# Patient Record
Sex: Female | Born: 1990 | Race: White | Hispanic: No | Marital: Single | Smoking: Former smoker
Health system: Southern US, Community
[De-identification: ages and names within clinical notes are randomized; demographics above are authoritative.]

## PROBLEM LIST (undated history)

## (undated) DIAGNOSIS — T7840XA Allergy, unspecified, initial encounter: Secondary | ICD-10-CM

## (undated) DIAGNOSIS — F319 Bipolar disorder, unspecified: Secondary | ICD-10-CM

## (undated) DIAGNOSIS — G43909 Migraine, unspecified, not intractable, without status migrainosus: Secondary | ICD-10-CM

## (undated) DIAGNOSIS — M1990 Unspecified osteoarthritis, unspecified site: Secondary | ICD-10-CM

## (undated) DIAGNOSIS — M19179 Post-traumatic osteoarthritis, unspecified ankle and foot: Secondary | ICD-10-CM

## (undated) DIAGNOSIS — R519 Headache, unspecified: Secondary | ICD-10-CM

## (undated) DIAGNOSIS — R51 Headache: Secondary | ICD-10-CM

## (undated) DIAGNOSIS — S82201B Unspecified fracture of shaft of right tibia, initial encounter for open fracture type I or II: Secondary | ICD-10-CM

## (undated) DIAGNOSIS — I209 Angina pectoris, unspecified: Secondary | ICD-10-CM

## (undated) DIAGNOSIS — S93326A Dislocation of tarsometatarsal joint of unspecified foot, initial encounter: Secondary | ICD-10-CM

## (undated) DIAGNOSIS — E669 Obesity, unspecified: Secondary | ICD-10-CM

## (undated) DIAGNOSIS — S92102A Unspecified fracture of left talus, initial encounter for closed fracture: Secondary | ICD-10-CM

## (undated) DIAGNOSIS — K219 Gastro-esophageal reflux disease without esophagitis: Secondary | ICD-10-CM

## (undated) DIAGNOSIS — S46909A Unspecified injury of unspecified muscle, fascia and tendon at shoulder and upper arm level, unspecified arm, initial encounter: Secondary | ICD-10-CM

## (undated) DIAGNOSIS — S82891B Other fracture of right lower leg, initial encounter for open fracture type I or II: Secondary | ICD-10-CM

## (undated) HISTORY — DX: Allergy, unspecified, initial encounter: T78.40XA

## (undated) HISTORY — PX: FRACTURE SURGERY: SHX138

## (undated) HISTORY — DX: Bipolar disorder, unspecified: F31.9

## (undated) HISTORY — PX: LEG SURGERY: SHX1003

---
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
## 2011-01-12 ENCOUNTER — Inpatient Hospital Stay (HOSPITAL_COMMUNITY): Payer: BC Managed Care – PPO

## 2011-01-12 ENCOUNTER — Emergency Department (HOSPITAL_COMMUNITY): Payer: BC Managed Care – PPO

## 2011-01-12 ENCOUNTER — Inpatient Hospital Stay (HOSPITAL_COMMUNITY)
Admission: EM | Admit: 2011-01-12 | Discharge: 2011-02-01 | DRG: 217 | Disposition: A | Discharge status: Home Health | Payer: BC Managed Care – PPO | Source: Ambulatory Visit | Attending: General Surgery | Admitting: General Surgery

## 2011-01-12 DIAGNOSIS — S8263XB Displaced fracture of lateral malleolus of unspecified fibula, initial encounter for open fracture type I or II: Secondary | ICD-10-CM | POA: Diagnosis present

## 2011-01-12 DIAGNOSIS — S0180XA Unspecified open wound of other part of head, initial encounter: Secondary | ICD-10-CM

## 2011-01-12 DIAGNOSIS — S060XAA Concussion with loss of consciousness status unknown, initial encounter: Secondary | ICD-10-CM | POA: Diagnosis present

## 2011-01-12 DIAGNOSIS — S92309A Fracture of unspecified metatarsal bone(s), unspecified foot, initial encounter for closed fracture: Secondary | ICD-10-CM | POA: Diagnosis present

## 2011-01-12 DIAGNOSIS — K219 Gastro-esophageal reflux disease without esophagitis: Secondary | ICD-10-CM | POA: Diagnosis present

## 2011-01-12 DIAGNOSIS — F17200 Nicotine dependence, unspecified, uncomplicated: Secondary | ICD-10-CM | POA: Diagnosis present

## 2011-01-12 DIAGNOSIS — IMO0002 Reserved for concepts with insufficient information to code with codable children: Secondary | ICD-10-CM

## 2011-01-12 DIAGNOSIS — J45990 Exercise induced bronchospasm: Secondary | ICD-10-CM | POA: Diagnosis present

## 2011-01-12 DIAGNOSIS — S93439A Sprain of tibiofibular ligament of unspecified ankle, initial encounter: Secondary | ICD-10-CM | POA: Diagnosis present

## 2011-01-12 DIAGNOSIS — S060X9A Concussion with loss of consciousness of unspecified duration, initial encounter: Secondary | ICD-10-CM | POA: Diagnosis present

## 2011-01-12 DIAGNOSIS — S82209B Unspecified fracture of shaft of unspecified tibia, initial encounter for open fracture type I or II: Principal | ICD-10-CM | POA: Diagnosis present

## 2011-01-12 DIAGNOSIS — F22 Delusional disorders: Secondary | ICD-10-CM | POA: Diagnosis present

## 2011-01-12 DIAGNOSIS — E669 Obesity, unspecified: Secondary | ICD-10-CM | POA: Diagnosis present

## 2011-01-12 DIAGNOSIS — S92253A Displaced fracture of navicular [scaphoid] of unspecified foot, initial encounter for closed fracture: Secondary | ICD-10-CM | POA: Diagnosis present

## 2011-01-12 DIAGNOSIS — S022XXA Fracture of nasal bones, initial encounter for closed fracture: Secondary | ICD-10-CM | POA: Diagnosis present

## 2011-01-12 DIAGNOSIS — S61409A Unspecified open wound of unspecified hand, initial encounter: Secondary | ICD-10-CM | POA: Diagnosis present

## 2011-01-12 DIAGNOSIS — E119 Type 2 diabetes mellitus without complications: Secondary | ICD-10-CM | POA: Diagnosis present

## 2011-01-12 DIAGNOSIS — S82409B Unspecified fracture of shaft of unspecified fibula, initial encounter for open fracture type I or II: Principal | ICD-10-CM | POA: Diagnosis present

## 2011-01-12 DIAGNOSIS — M25571 Pain in right ankle and joints of right foot: Secondary | ICD-10-CM

## 2011-01-12 DIAGNOSIS — S41009A Unspecified open wound of unspecified shoulder, initial encounter: Secondary | ICD-10-CM | POA: Diagnosis present

## 2011-01-12 DIAGNOSIS — S92109A Unspecified fracture of unspecified talus, initial encounter for closed fracture: Secondary | ICD-10-CM | POA: Diagnosis present

## 2011-01-12 DIAGNOSIS — S0120XA Unspecified open wound of nose, initial encounter: Secondary | ICD-10-CM | POA: Diagnosis present

## 2011-01-12 DIAGNOSIS — D62 Acute posthemorrhagic anemia: Secondary | ICD-10-CM | POA: Diagnosis present

## 2011-01-12 DIAGNOSIS — S92213A Displaced fracture of cuboid bone of unspecified foot, initial encounter for closed fracture: Secondary | ICD-10-CM | POA: Diagnosis present

## 2011-01-12 DIAGNOSIS — S82899A Other fracture of unspecified lower leg, initial encounter for closed fracture: Secondary | ICD-10-CM

## 2011-01-12 DIAGNOSIS — T07XXXA Unspecified multiple injuries, initial encounter: Secondary | ICD-10-CM

## 2011-01-12 DIAGNOSIS — F1910 Other psychoactive substance abuse, uncomplicated: Secondary | ICD-10-CM | POA: Diagnosis present

## 2011-01-12 DIAGNOSIS — Z7901 Long term (current) use of anticoagulants: Secondary | ICD-10-CM

## 2011-01-12 LAB — URINALYSIS, ROUTINE W REFLEX MICROSCOPIC
Glucose, UA: NEGATIVE mg/dL
Leukocytes, UA: NEGATIVE
Nitrite: NEGATIVE
Specific Gravity, Urine: 1.037 — ABNORMAL HIGH (ref 1.005–1.030)
pH: 5.5 (ref 5.0–8.0)

## 2011-01-12 LAB — COMPREHENSIVE METABOLIC PANEL
ALT: 30 U/L (ref 0–35)
Albumin: 3.8 g/dL (ref 3.5–5.2)
Calcium: 8.9 mg/dL (ref 8.4–10.5)
GFR calc Af Amer: >60 mL/min (ref >60)
Glucose, Bld: 177 mg/dL — ABNORMAL HIGH (ref 70–99)
Potassium: 3 mEq/L — ABNORMAL LOW (ref 3.5–5.1)
Sodium: 139 mEq/L (ref 135–145)
Total Protein: 7.1 g/dL (ref 6.0–8.3)

## 2011-01-12 LAB — BASIC METABOLIC PANEL
CO2: 21 mEq/L (ref 19–32)
Chloride: 108 mEq/L (ref 96–112)
GFR calc non Af Amer: >60 mL/min (ref >60)
Glucose, Bld: 120 mg/dL — ABNORMAL HIGH (ref 70–99)
Potassium: 4.2 mEq/L (ref 3.5–5.1)
Sodium: 139 mEq/L (ref 135–145)

## 2011-01-12 LAB — TYPE AND SCREEN
Antibody Screen: NEGATIVE
Unit division: 0
Unit division: 0

## 2011-01-12 LAB — POCT I-STAT, CHEM 8
BUN: 13 mg/dL (ref 6–23)
Calcium, Ion: 1.08 mmol/L — ABNORMAL LOW (ref 1.12–1.32)
Chloride: 105 mEq/L (ref 96–112)
Glucose, Bld: 176 mg/dL — ABNORMAL HIGH (ref 70–99)
HCT: 40 % (ref 36.0–46.0)

## 2011-01-12 LAB — HCG, QUANTITATIVE, PREGNANCY: hCG, Beta Chain, Quant, S: 1 m[IU]/mL (ref <5)

## 2011-01-12 LAB — CBC
HCT: 35.9 % — ABNORMAL LOW (ref 36.0–46.0)
HCT: 25.8 % — ABNORMAL LOW (ref 36.0–46.0)
Hemoglobin: 12.7 g/dL (ref 12.0–15.0)
Hemoglobin: 8.8 g/dL — ABNORMAL LOW (ref 12.0–15.0)
MCV: 86.7 fL (ref 78.0–100.0)
RBC: 4.14 MIL/uL (ref 3.87–5.11)
RBC: 2.93 MIL/uL — ABNORMAL LOW (ref 3.87–5.11)
RDW: 12.8 % (ref 11.5–15.5)
WBC: 21.6 10*3/uL — ABNORMAL HIGH (ref 4.0–10.5)
WBC: 8.1 10*3/uL (ref 4.0–10.5)

## 2011-01-12 LAB — ETHANOL: Alcohol, Ethyl (B): 205 mg/dL — ABNORMAL HIGH (ref 0–11)

## 2011-01-12 LAB — RAPID URINE DRUG SCREEN, HOSP PERFORMED: Opiates: NOT DETECTED

## 2011-01-12 LAB — PROTIME-INR: INR: 1.01 (ref 0.00–1.49)

## 2011-01-12 LAB — URINE MICROSCOPIC-ADD ON

## 2011-01-12 LAB — GLUCOSE, CAPILLARY: Glucose-Capillary: 128 mg/dL — ABNORMAL HIGH (ref 70–99)

## 2011-01-12 LAB — ABO/RH: ABO/RH(D): A POS

## 2011-01-12 MED ORDER — IOHEXOL 300 MG/ML  SOLN
100.0000 mL | Freq: Once | INTRAMUSCULAR | Status: AC | PRN
  Administered 2011-01-12: 100 mL via INTRAVENOUS

## 2011-01-13 ENCOUNTER — Inpatient Hospital Stay (HOSPITAL_COMMUNITY): Payer: BC Managed Care – PPO

## 2011-01-13 LAB — CBC
HCT: 25 % — ABNORMAL LOW (ref 36.0–46.0)
Hemoglobin: 8.5 g/dL — ABNORMAL LOW (ref 12.0–15.0)
MCH: 29.9 pg (ref 26.0–34.0)
MCV: 88 fL (ref 78.0–100.0)
RBC: 2.84 MIL/uL — ABNORMAL LOW (ref 3.87–5.11)

## 2011-01-13 LAB — BASIC METABOLIC PANEL
Calcium: 8.1 mg/dL — ABNORMAL LOW (ref 8.4–10.5)
GFR calc Af Amer: >60 mL/min (ref >60)
GFR calc non Af Amer: >60 mL/min (ref >60)
Potassium: 3.8 mEq/L (ref 3.5–5.1)
Sodium: 137 mEq/L (ref 135–145)

## 2011-01-14 ENCOUNTER — Inpatient Hospital Stay (HOSPITAL_COMMUNITY): Payer: BC Managed Care – PPO

## 2011-01-14 LAB — BASIC METABOLIC PANEL
BUN: 3 mg/dL — ABNORMAL LOW (ref 6–23)
Chloride: 102 mEq/L (ref 96–112)
GFR calc Af Amer: >60 mL/min (ref >60)
GFR calc non Af Amer: >60 mL/min (ref >60)
Potassium: 3.7 mEq/L (ref 3.5–5.1)
Sodium: 135 mEq/L (ref 135–145)

## 2011-01-14 LAB — CBC
HCT: 22.3 % — ABNORMAL LOW (ref 36.0–46.0)
Hemoglobin: 7.7 g/dL — ABNORMAL LOW (ref 12.0–15.0)
MCHC: 34.5 g/dL (ref 30.0–36.0)
RDW: 12.7 % (ref 11.5–15.5)
WBC: 6.9 10*3/uL (ref 4.0–10.5)

## 2011-01-14 LAB — URINALYSIS, DIPSTICK ONLY
Glucose, UA: NEGATIVE mg/dL
Leukocytes, UA: NEGATIVE
Nitrite: NEGATIVE
Protein, ur: NEGATIVE mg/dL
pH: 6 (ref 5.0–8.0)

## 2011-01-15 ENCOUNTER — Inpatient Hospital Stay (HOSPITAL_COMMUNITY): Payer: BC Managed Care – PPO

## 2011-01-15 LAB — BASIC METABOLIC PANEL
BUN: 4 mg/dL — ABNORMAL LOW (ref 6–23)
CO2: 26 mEq/L (ref 19–32)
Chloride: 102 mEq/L (ref 96–112)
Glucose, Bld: 144 mg/dL — ABNORMAL HIGH (ref 70–99)
Potassium: 4.2 mEq/L (ref 3.5–5.1)
Sodium: 134 mEq/L — ABNORMAL LOW (ref 135–145)

## 2011-01-15 LAB — URINALYSIS, MICROSCOPIC ONLY
Bilirubin Urine: NEGATIVE
Hgb urine dipstick: NEGATIVE
Nitrite: NEGATIVE
Specific Gravity, Urine: 1.018 (ref 1.005–1.030)
pH: 6 (ref 5.0–8.0)

## 2011-01-15 LAB — CBC
HCT: 21.2 % — ABNORMAL LOW (ref 36.0–46.0)
Hemoglobin: 7.2 g/dL — ABNORMAL LOW (ref 12.0–15.0)
MCHC: 34 g/dL (ref 30.0–36.0)
RBC: 2.45 MIL/uL — ABNORMAL LOW (ref 3.87–5.11)

## 2011-01-16 LAB — URINE CULTURE: Colony Count: NO GROWTH

## 2011-01-16 LAB — CBC
MCH: 29.6 pg (ref 26.0–34.0)
Platelets: 135 10*3/uL — ABNORMAL LOW (ref 150–400)
RBC: 2.13 MIL/uL — ABNORMAL LOW (ref 3.87–5.11)
RDW: 12.7 % (ref 11.5–15.5)
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
## 2011-01-16 NOTE — Op Note (Signed)
NAME:  [name], [name] NO.:  [id number]  MEDICAL RECORD NO.:  [id number]  LOCATION:  3310                         FACILITY:  [facility]  PHYSICIAN:  Johnette Abraham, MD    DATE OF BIRTH:  1990-07-28  DATE OF PROCEDURE:  01/12/2011 DATE OF DISCHARGE:                              OPERATIVE REPORT   PREOPERATIVE DIAGNOSES:  Complex laceration of the right hand with possible of flexor tendon injury of the right ring finger and complex laceration of the right shoulder.  POSTOPERATIVE DIAGNOSES:  Complex laceration of the right hand with possible of flexor tendon injury of the right ring finger and complex laceration of the right shoulder.  PROCEDURE: 1. Exploration of wounds x2 of the right hand, the right ring finger,     and the right long finger wounds.  Repair of the FDS and FDP in     zone II to III of the right ring finger.  Release of the A1 pulley     of the right ring finger.  Reconstruction of the A2 pulley of the     right ring finger.  Closure of lacerations of the right hand,     totaling 8 cm. 2. Irrigation and debridement of full-thickness skin, subcutaneous     tissue, and muscle of the right shoulder and layered closure of the     shoulder wound measuring 6 cm.  SURGEON:  Maya Arcand C. Izora Ribas, MD  ASSISTANT:  None.  ANESTHESIA:  General.  Of note, this procedure was performed at the conclusion of Dr. Ashok Norris procedure.  FINDINGS:  Flexor tendon laceration of the right ring finger including the FDS and FDP, multiple lacerations and abrasions about the right palm and right forearm, complex laceration and gaping wound of the right shoulder approximately 5 x 4 cm in diameter down and into the deltoid muscle.  SPECIMENS:  No specimens.  ESTIMATED BLOOD LOSS:  Minimal.  COMPLICATIONS:  No acute complications.  PROCEDURE:  The patient was already intubated and asleep while I evaluated and started my procedure.  A time-out was performed.   The right upper extremity was prepped and draped in normal sterile fashion. The arm was exsanguinated.  Tourniquet was used.  Initially, all of the wounds on the hand were scrubbed and irrigated thoroughly with irrigation solution.  There was some dirt and wooden foreign bodies that were removed from the wounds at the base of the right ring and right long fingers.  Each wound was then explored and evaluated.  The wound of the right ring finger was all the way down to the radial-sided neurovascular bundle.  However, the neurovascular bundles were intact. The ulnar neurovascular bundles were also intact.  Flexor tendons were intact.  This wound extended along the radial aspect into the web space and there was a separate approximately 1-cm wound on the dorsal aspect of the hand.  All of these were irrigated out.  The wound on the dorsum was fairly superficial down through the subcutaneous tissue but did not involve the tendon.  There was a small about 0.5 cm laceration of the base of the right index finger.  This was also irrigated.  This was fairly  superficial and did not go beyond the subcutaneous tissues.  The larger and more serious of the wounds was a sort of a stellate laceration at the base of the right ring finger measuring approximately 4.5 cm.  This wound was quite deep.  The edges of the flexor tendon were visible once the wound was opened.  The wound was then opened slightly in a proximal and distal direction to obtain good exposure.  The neurovascular bundles were intact, however.  Both the FDS and FDP tendons were lacerated just underneath the A2 pulley.  To gain length, the A1 pulley was released and the A2 pulley was taken down in a stepwise fashion for approximately 50% of the length.  The FDP and FDS tendons were identified after the wound was cleansed.  The FDP was repaired first with 4-0 FiberWire in a modified Kessler-type repair.  A 5-0 Prolene was used in a  circumferential running fashion nicely approximating the ends.  Gentle range of motion after this repair did not reveal any gaping of the repair.  The FDS was brought into approximation and again repaired in a similar fashion with 4-0 FiberWire.  To prevent bowstringing, the stepwise fashion edges of the A2 pulley were brought into approximation and sutured with Prolene. Gentle range of motion did not reveal any catching of the tendon repair on this pulley reconstruction.  Following all wounds were repaired with interrupted 5-0 nylon sutures, the sum length of the repairs of all these lacerations was 8 cm.  The tourniquet was then released.  All fingers returned to nice pink color.  There was no gross bleeding from wound.  Sterile dressing and splint were applied.  Next, the strap was removed and the right shoulder was reprepped and draped.  A pulse lavage was used to nicely irrigate the wound cavity.  The wound cavity measured approximately 5 x 4 cm.  It was down through the deltopectoral fascia and some of the deltoid muscle fibers were visible.  Hemostasis was obtained.  Once the wounds was cleansed, the deltopectoral fascia was closed with multiple interrupted 3-0 Vicryl sutures.  Subcutaneous layers were also repaired with interrupted 3-0 Vicryl sutures.  The edges of the wound were jagged and therefore, an elliptical incision was made outside of each wound edge creating a nice sharp skin edges for closure.  The skin was closed with a running subcuticular 4-0 Vicryl. Steri-Strips and sterile dressing were obtained.  The patient tolerated these procedures well.     Johnette Abraham, MD     HCC/MEDQ  D:  01/12/2011  T:  01/12/2011  Job:  [id number]  Electronically Signed by Knute Neu MD on 01/16/2011 07:24:56 AM
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
## 2011-01-17 LAB — BASIC METABOLIC PANEL
Calcium: 8.5 mg/dL (ref 8.4–10.5)
Chloride: 95 mEq/L — ABNORMAL LOW (ref 96–112)
Creatinine, Ser: 0.65 mg/dL (ref 0.50–1.10)
GFR calc Af Amer: >60 mL/min (ref >60)
GFR calc non Af Amer: >60 mL/min (ref >60)

## 2011-01-17 LAB — TYPE AND SCREEN
ABO/RH(D): A POS
Antibody Screen: NEGATIVE
Unit division: 0

## 2011-01-17 LAB — CBC
MCHC: 35 g/dL (ref 30.0–36.0)
MCV: 85 fL (ref 78.0–100.0)
Platelets: 178 10*3/uL (ref 150–400)
RDW: 13.3 % (ref 11.5–15.5)
WBC: 6 10*3/uL (ref 4.0–10.5)

## 2011-01-18 LAB — CBC
Platelets: 165 10*3/uL (ref 150–400)
RBC: 2.76 MIL/uL — ABNORMAL LOW (ref 3.87–5.11)
RDW: 13.2 % (ref 11.5–15.5)
WBC: 4.5 10*3/uL (ref 4.0–10.5)

## 2011-01-18 LAB — BASIC METABOLIC PANEL
CO2: 29 mEq/L (ref 19–32)
Chloride: 97 mEq/L (ref 96–112)
GFR calc Af Amer: >60 mL/min (ref >60)
Potassium: 3.5 mEq/L (ref 3.5–5.1)
Sodium: 134 mEq/L — ABNORMAL LOW (ref 135–145)

## 2011-01-19 LAB — CBC
HCT: 23.6 % — ABNORMAL LOW (ref 36.0–46.0)
MCV: 86.1 fL (ref 78.0–100.0)
Platelets: 211 10*3/uL (ref 150–400)
RBC: 2.74 MIL/uL — ABNORMAL LOW (ref 3.87–5.11)
RDW: 12.9 % (ref 11.5–15.5)
WBC: 5.3 10*3/uL (ref 4.0–10.5)

## 2011-01-20 LAB — BASIC METABOLIC PANEL
BUN: 7 mg/dL (ref 6–23)
CO2: 30 mEq/L (ref 19–32)
GFR calc non Af Amer: >60 mL/min (ref >60)
Glucose, Bld: 88 mg/dL (ref 70–99)
Potassium: 3.9 mEq/L (ref 3.5–5.1)
Sodium: 134 mEq/L — ABNORMAL LOW (ref 135–145)

## 2011-01-20 LAB — CBC
HCT: 26.1 % — ABNORMAL LOW (ref 36.0–46.0)
Hemoglobin: 8.9 g/dL — ABNORMAL LOW (ref 12.0–15.0)
MCH: 29.2 pg (ref 26.0–34.0)
MCHC: 34.1 g/dL (ref 30.0–36.0)
RBC: 3.05 MIL/uL — ABNORMAL LOW (ref 3.87–5.11)

## 2011-01-21 ENCOUNTER — Inpatient Hospital Stay (HOSPITAL_COMMUNITY): Payer: BC Managed Care – PPO

## 2011-01-22 ENCOUNTER — Inpatient Hospital Stay (HOSPITAL_COMMUNITY): Payer: BC Managed Care – PPO

## 2011-01-22 LAB — BASIC METABOLIC PANEL
BUN: 8 mg/dL (ref 6–23)
CO2: 28 mEq/L (ref 19–32)
Chloride: 96 mEq/L (ref 96–112)
Creatinine, Ser: 0.66 mg/dL (ref 0.50–1.10)
GFR calc Af Amer: >60 mL/min (ref >60)
GFR calc non Af Amer: >60 mL/min (ref >60)
Glucose, Bld: 112 mg/dL — ABNORMAL HIGH (ref 70–99)
Potassium: 3.7 mEq/L (ref 3.5–5.1)
Sodium: 133 mEq/L — ABNORMAL LOW (ref 135–145)

## 2011-01-22 LAB — CBC
Hemoglobin: 9.3 g/dL — ABNORMAL LOW (ref 12.0–15.0)
Platelets: 380 10*3/uL (ref 150–400)
RBC: 3.23 MIL/uL — ABNORMAL LOW (ref 3.87–5.11)
WBC: 7.9 10*3/uL (ref 4.0–10.5)

## 2011-01-22 LAB — CULTURE, BLOOD (ROUTINE X 2)
Culture  Setup Time: 201207250227
Culture: NO GROWTH
Culture: NO GROWTH

## 2011-01-22 LAB — GRAM STAIN

## 2011-01-23 LAB — CBC
Hemoglobin: 8.4 g/dL — ABNORMAL LOW (ref 12.0–15.0)
MCH: 28.2 pg (ref 26.0–34.0)
RBC: 2.98 MIL/uL — ABNORMAL LOW (ref 3.87–5.11)

## 2011-01-25 LAB — WOUND CULTURE: Culture: NO GROWTH

## 2011-01-25 LAB — TYPE AND SCREEN
Antibody Screen: NEGATIVE
Unit division: 0

## 2011-01-25 LAB — PROTIME-INR: Prothrombin Time: 14.5 seconds (ref 11.6–15.2)

## 2011-01-26 LAB — PROTIME-INR
INR: 1.29 (ref 0.00–1.49)
Prothrombin Time: 16.3 seconds — ABNORMAL HIGH (ref 11.6–15.2)

## 2011-01-27 LAB — PROTIME-INR: Prothrombin Time: 19.5 seconds — ABNORMAL HIGH (ref 11.6–15.2)

## 2011-01-27 LAB — ANAEROBIC CULTURE

## 2011-01-28 LAB — PROTIME-INR: INR: 2.01 — ABNORMAL HIGH (ref 0.00–1.49)

## 2011-01-28 NOTE — Op Note (Signed)
  NAME:  [name], [name] NO.:  [id number]  MEDICAL RECORD NO.:  [id number]  LOCATION:  [facility]                         FACILITY:  [facility]  PHYSICIAN:  Almond Lint, MD       DATE OF BIRTH:  12-19-1990  DATE OF PROCEDURE:  01/12/2011 DATE OF DISCHARGE:                              OPERATIVE REPORT   PREOPERATIVE DIAGNOSIS:  Inadequate IV access.  POSTOPERATIVE DIAGNOSIS:  Inadequate IV access.  PROCEDURE PERFORMED:  Left femoral central line placement.  SURGEON:  Almond Lint, MD  ANESTHESIA:  Local and IV narcotics.  ESTIMATED BLOOD LOSS:  Minimal.  COMPLICATIONS:  None known.  PROCEDURE:  [name] was identified in her bed and had no IV access.  A left subclavian line was attempted, but the vein was unable to be accessed.  Her left groin was then prepped and draped in sterile fashion. The artery was accessed briefly once and then the left vein was accessed in the second stick.  The wire was threaded easily.  Needle was removed and the skin nick was made with #11 blade.  The Cordis was passed over the wire and the dilator was removed.  Good blood return and easy flush was obtained.  This was sutured to the skin and dressed with a sterile dressing.  The patient tolerated the procedure well.  She then went to the CT scan.     Almond Lint, MD     FB/MEDQ  D:  01/12/2011  T:  01/12/2011  Job:  [id number]  Electronically Signed by Almond Lint MD on 01/28/2011 01:58:00 PM
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
## 2011-01-29 DIAGNOSIS — S82209A Unspecified fracture of shaft of unspecified tibia, initial encounter for closed fracture: Secondary | ICD-10-CM

## 2011-01-29 DIAGNOSIS — S069X9A Unspecified intracranial injury with loss of consciousness of unspecified duration, initial encounter: Secondary | ICD-10-CM

## 2011-01-29 DIAGNOSIS — S92309A Fracture of unspecified metatarsal bone(s), unspecified foot, initial encounter for closed fracture: Secondary | ICD-10-CM

## 2011-01-29 DIAGNOSIS — S0280XA Fracture of other specified skull and facial bones, unspecified side, initial encounter for closed fracture: Secondary | ICD-10-CM

## 2011-01-29 DIAGNOSIS — S92209A Fracture of unspecified tarsal bone(s) of unspecified foot, initial encounter for closed fracture: Secondary | ICD-10-CM

## 2011-01-30 LAB — CBC
HCT: 31.9 % — ABNORMAL LOW (ref 36.0–46.0)
Hemoglobin: 10.2 g/dL — ABNORMAL LOW (ref 12.0–15.0)
RBC: 3.69 MIL/uL — ABNORMAL LOW (ref 3.87–5.11)
WBC: 4.1 10*3/uL (ref 4.0–10.5)

## 2011-01-30 LAB — PROTIME-INR: INR: 2.27 — ABNORMAL HIGH (ref 0.00–1.49)

## 2011-01-31 LAB — PROTIME-INR: INR: 2.19 — ABNORMAL HIGH (ref 0.00–1.49)

## 2011-02-01 LAB — PROTIME-INR
INR: 2.11 — ABNORMAL HIGH (ref 0.00–1.49)
Prothrombin Time: 24 seconds — ABNORMAL HIGH (ref 11.6–15.2)
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
## 2011-02-13 ENCOUNTER — Telehealth (INDEPENDENT_AMBULATORY_CARE_PROVIDER_SITE_OTHER): Payer: Self-pay | Admitting: Orthopedic Surgery

## 2011-02-13 NOTE — Telephone Encounter (Signed)
Mom called to say [name] had not had a BM in 6 days. Has been on Dulcolax 5mg  daily and colace 100mg  bid since d/c and miralax 17g daily for the past 4 days. I advised her to increase Dulcolax to 15mg  daily and if no BM by tomorrow night to take 1 bottle of magnesium citrate.
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
## 2011-03-18 ENCOUNTER — Ambulatory Visit
Admission: RE | Admit: 2011-03-18 | Discharge: 2011-03-18 | Disposition: A | Discharge status: Home / Self Care | Payer: BC Managed Care – PPO | Source: Ambulatory Visit | Attending: Orthopedic Surgery | Admitting: Orthopedic Surgery

## 2011-03-18 ENCOUNTER — Other Ambulatory Visit: Payer: Self-pay | Admitting: Orthopedic Surgery

## 2011-03-18 DIAGNOSIS — M25579 Pain in unspecified ankle and joints of unspecified foot: Secondary | ICD-10-CM

## 2011-03-25 DIAGNOSIS — S82201B Unspecified fracture of shaft of right tibia, initial encounter for open fracture type I or II: Secondary | ICD-10-CM

## 2011-03-25 DIAGNOSIS — S46909A Unspecified injury of unspecified muscle, fascia and tendon at shoulder and upper arm level, unspecified arm, initial encounter: Secondary | ICD-10-CM

## 2011-03-25 DIAGNOSIS — S92102A Unspecified fracture of left talus, initial encounter for closed fracture: Secondary | ICD-10-CM

## 2011-03-25 DIAGNOSIS — S82891B Other fracture of right lower leg, initial encounter for open fracture type I or II: Secondary | ICD-10-CM

## 2011-03-25 DIAGNOSIS — S93326A Dislocation of tarsometatarsal joint of unspecified foot, initial encounter: Secondary | ICD-10-CM

## 2011-03-25 HISTORY — DX: Unspecified injury of unspecified muscle, fascia and tendon at shoulder and upper arm level, unspecified arm, initial encounter: S46.909A

## 2011-03-25 HISTORY — DX: Unspecified fracture of shaft of right tibia, initial encounter for open fracture type I or II: S82.201B

## 2011-03-25 HISTORY — DX: Other fracture of right lower leg, initial encounter for open fracture type I or II: S82.891B

## 2011-03-25 HISTORY — DX: Unspecified fracture of left talus, initial encounter for closed fracture: S92.102A

## 2011-03-25 HISTORY — DX: Dislocation of tarsometatarsal joint of unspecified foot, initial encounter: S93.326A

## 2011-03-27 ENCOUNTER — Other Ambulatory Visit: Payer: Self-pay | Admitting: General Surgery

## 2011-03-27 DIAGNOSIS — M79641 Pain in right hand: Secondary | ICD-10-CM

## 2011-03-28 ENCOUNTER — Ambulatory Visit (HOSPITAL_COMMUNITY)
Admission: RE | Admit: 2011-03-28 | Discharge: 2011-03-28 | Disposition: A | Discharge status: Home / Self Care | Payer: BC Managed Care – PPO | Source: Ambulatory Visit | Attending: Orthopedic Surgery | Admitting: Orthopedic Surgery

## 2011-03-28 DIAGNOSIS — IMO0001 Reserved for inherently not codable concepts without codable children: Secondary | ICD-10-CM | POA: Insufficient documentation

## 2011-03-28 DIAGNOSIS — M24573 Contracture, unspecified ankle: Secondary | ICD-10-CM | POA: Insufficient documentation

## 2011-03-28 DIAGNOSIS — J45909 Unspecified asthma, uncomplicated: Secondary | ICD-10-CM | POA: Insufficient documentation

## 2011-03-28 DIAGNOSIS — Z4789 Encounter for other orthopedic aftercare: Secondary | ICD-10-CM | POA: Insufficient documentation

## 2011-03-28 LAB — APTT: aPTT: 39 seconds — ABNORMAL HIGH (ref 24–37)

## 2011-03-28 LAB — CBC
Platelets: 200 10*3/uL (ref 150–400)
RBC: 4.31 MIL/uL (ref 3.87–5.11)
RDW: 13.5 % (ref 11.5–15.5)
WBC: 5.8 10*3/uL (ref 4.0–10.5)

## 2011-03-28 LAB — PROTIME-INR: Prothrombin Time: 19.8 seconds — ABNORMAL HIGH (ref 11.6–15.2)

## 2011-03-28 LAB — HCG, SERUM, QUALITATIVE: Preg, Serum: NEGATIVE

## 2011-04-02 ENCOUNTER — Other Ambulatory Visit: Payer: BC Managed Care – PPO
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
## 2011-07-12 ENCOUNTER — Ambulatory Visit
Admission: RE | Admit: 2011-07-12 | Discharge: 2011-07-12 | Disposition: A | Discharge status: Home / Self Care | Payer: BC Managed Care – PPO | Source: Ambulatory Visit | Attending: Orthopedic Surgery | Admitting: Orthopedic Surgery

## 2011-07-12 ENCOUNTER — Other Ambulatory Visit: Payer: Self-pay | Admitting: Orthopedic Surgery

## 2011-07-12 DIAGNOSIS — S92109A Unspecified fracture of unspecified talus, initial encounter for closed fracture: Secondary | ICD-10-CM

## 2012-01-09 ENCOUNTER — Other Ambulatory Visit: Payer: Self-pay | Admitting: Orthopedic Surgery

## 2012-01-09 DIAGNOSIS — S93439A Sprain of tibiofibular ligament of unspecified ankle, initial encounter: Secondary | ICD-10-CM

## 2012-01-10 ENCOUNTER — Inpatient Hospital Stay: Admission: RE | Admit: 2012-01-10 | Payer: BC Managed Care – PPO | Source: Ambulatory Visit

## 2012-01-10 ENCOUNTER — Ambulatory Visit
Admission: RE | Admit: 2012-01-10 | Discharge: 2012-01-10 | Disposition: A | Discharge status: Home / Self Care | Payer: BC Managed Care – PPO | Source: Ambulatory Visit | Attending: Orthopedic Surgery | Admitting: Orthopedic Surgery

## 2012-01-10 DIAGNOSIS — S93439A Sprain of tibiofibular ligament of unspecified ankle, initial encounter: Secondary | ICD-10-CM

## 2012-01-24 ENCOUNTER — Encounter (HOSPITAL_COMMUNITY): Payer: Self-pay | Admitting: Pharmacy Technician

## 2012-01-27 ENCOUNTER — Encounter (HOSPITAL_COMMUNITY): Admission: RE | Admit: 2012-01-27 | Payer: BC Managed Care – PPO | Source: Ambulatory Visit

## 2012-01-28 ENCOUNTER — Encounter (HOSPITAL_COMMUNITY): Admission: RE | Payer: Self-pay | Source: Ambulatory Visit

## 2012-01-28 ENCOUNTER — Ambulatory Visit (HOSPITAL_COMMUNITY)
Admission: RE | Admit: 2012-01-28 | Payer: BC Managed Care – PPO | Source: Ambulatory Visit | Admitting: Orthopedic Surgery

## 2012-01-28 SURGERY — ARTHRODESIS ANKLE
Anesthesia: Regional | Laterality: Right

## 2012-02-07 ENCOUNTER — Encounter (HOSPITAL_COMMUNITY): Payer: Self-pay

## 2012-02-07 ENCOUNTER — Encounter (HOSPITAL_COMMUNITY)
Admission: RE | Admit: 2012-02-07 | Discharge: 2012-02-07 | Disposition: A | Discharge status: Home / Self Care | Payer: BC Managed Care – PPO | Source: Ambulatory Visit | Attending: Orthopedic Surgery | Admitting: Orthopedic Surgery

## 2012-02-07 HISTORY — DX: Unspecified osteoarthritis, unspecified site: M19.90

## 2012-02-07 LAB — TYPE AND SCREEN
ABO/RH(D): A POS
Antibody Screen: NEGATIVE

## 2012-02-07 LAB — COMPREHENSIVE METABOLIC PANEL
ALT: 31 U/L (ref 0–35)
AST: 24 U/L (ref 0–37)
Albumin: 4.2 g/dL (ref 3.5–5.2)
CO2: 28 mEq/L (ref 19–32)
Calcium: 10.1 mg/dL (ref 8.4–10.5)
Sodium: 139 mEq/L (ref 135–145)
Total Protein: 8 g/dL (ref 6.0–8.3)

## 2012-02-07 LAB — CBC WITH DIFFERENTIAL/PLATELET
Basophils Absolute: 0 10*3/uL (ref 0.0–0.1)
Basophils Relative: 0 % (ref 0–1)
Eosinophils Absolute: 0.3 10*3/uL (ref 0.0–0.7)
Eosinophils Relative: 5 % (ref 0–5)
Lymphocytes Relative: 34 % (ref 12–46)
MCV: 87.8 fL (ref 78.0–100.0)
Platelets: 192 10*3/uL (ref 150–400)
RDW: 12.5 % (ref 11.5–15.5)
WBC: 6.7 10*3/uL (ref 4.0–10.5)

## 2012-02-07 LAB — URINALYSIS, ROUTINE W REFLEX MICROSCOPIC
Ketones, ur: NEGATIVE mg/dL
Leukocytes, UA: NEGATIVE
Protein, ur: NEGATIVE mg/dL
Urobilinogen, UA: 0.2 mg/dL (ref 0.0–1.0)

## 2012-02-07 LAB — SURGICAL PCR SCREEN
MRSA, PCR: NEGATIVE
Staphylococcus aureus: NEGATIVE

## 2012-02-07 NOTE — Pre-Procedure Instructions (Signed)
21 [name]  02/07/2012   Your procedure is scheduled on:  Tuesday August 20  Report to [facility] at 5:30 AM.  Call this number if you have problems the morning of surgery: [phone]   Remember:   Do not eat food:After Midnight.  May have clear liquids:until Midnight .  Clear liquids include soda, tea, black coffee, apple or grape juice, broth.  Take these medicines the morning of surgery with A SIP OF WATER: none   Do not wear jewelry, make-up or nail polish.  Do not wear lotions, powders, or perfumes. You may wear deodorant.  Do not shave 48 hours prior to surgery. Men may shave face and neck.  Do not bring valuables to the hospital.  Contacts, dentures or bridgework may not be worn into surgery.  Leave suitcase in the car. After surgery it may be brought to your room.  For patients admitted to the hospital, checkout time is 11:00 AM the day of discharge.   Patients discharged the day of surgery will not be allowed to drive home.  Name and phone number of your driver: NA  Special Instructions: Incentive Spirometry - Practice and bring it with you on the day of surgery. and CHG Shower Use Special Wash: 1/2 bottle night before surgery and 1/2 bottle morning of surgery.   Please read over the following fact sheets that you were given: Pain Booklet, Coughing and Deep Breathing, Blood Transfusion Information and Surgical Site Infection Prevention

## 2012-02-08 LAB — URINE CULTURE

## 2012-02-10 MED ORDER — CHLORHEXIDINE GLUCONATE 4 % EX LIQD: 60.0000 mL | Freq: Once | CUTANEOUS | Status: DC

## 2012-02-10 MED ORDER — VANCOMYCIN HCL 1000 MG IV SOLR
1500.0000 mg | INTRAVENOUS | Status: AC
  Administered 2012-02-11: 1500 mg via INTRAVENOUS
  Filled 2012-02-10: qty 1500

## 2012-02-10 MED ORDER — LACTATED RINGERS IV SOLN: INTRAVENOUS | Status: DC
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
## 2012-02-11 ENCOUNTER — Inpatient Hospital Stay (HOSPITAL_COMMUNITY)
Admission: RE | Admit: 2012-02-11 | Discharge: 2012-02-14 | DRG: 218 | Disposition: A | Discharge status: Home / Self Care | Payer: BC Managed Care – PPO | Source: Ambulatory Visit | Attending: Orthopedic Surgery | Admitting: Orthopedic Surgery

## 2012-02-11 ENCOUNTER — Inpatient Hospital Stay (HOSPITAL_COMMUNITY): Payer: BC Managed Care – PPO

## 2012-02-11 ENCOUNTER — Ambulatory Visit (HOSPITAL_COMMUNITY): Payer: BC Managed Care – PPO | Admitting: Anesthesiology

## 2012-02-11 ENCOUNTER — Encounter (HOSPITAL_COMMUNITY)
Admission: RE | Disposition: A | Discharge status: Home / Self Care | Payer: Self-pay | Source: Ambulatory Visit | Attending: Orthopedic Surgery

## 2012-02-11 ENCOUNTER — Encounter (HOSPITAL_COMMUNITY): Payer: Self-pay | Admitting: Anesthesiology

## 2012-02-11 ENCOUNTER — Ambulatory Visit (HOSPITAL_COMMUNITY): Payer: BC Managed Care – PPO

## 2012-02-11 DIAGNOSIS — T84498A Other mechanical complication of other internal orthopedic devices, implants and grafts, initial encounter: Secondary | ICD-10-CM | POA: Diagnosis present

## 2012-02-11 DIAGNOSIS — IMO0002 Reserved for concepts with insufficient information to code with codable children: Principal | ICD-10-CM | POA: Diagnosis present

## 2012-02-11 DIAGNOSIS — S8290XS Unspecified fracture of unspecified lower leg, sequela: Secondary | ICD-10-CM

## 2012-02-11 DIAGNOSIS — F411 Generalized anxiety disorder: Secondary | ICD-10-CM | POA: Diagnosis present

## 2012-02-11 DIAGNOSIS — F17200 Nicotine dependence, unspecified, uncomplicated: Secondary | ICD-10-CM | POA: Diagnosis present

## 2012-02-11 DIAGNOSIS — R000 Tachycardia, unspecified: Secondary | ICD-10-CM

## 2012-02-11 DIAGNOSIS — Y838 Other surgical procedures as the cause of abnormal reaction of the patient, or of later complication, without mention of misadventure at the time of the procedure: Secondary | ICD-10-CM | POA: Diagnosis present

## 2012-02-11 DIAGNOSIS — M19179 Post-traumatic osteoarthritis, unspecified ankle and foot: Secondary | ICD-10-CM

## 2012-02-11 DIAGNOSIS — Z01812 Encounter for preprocedural laboratory examination: Secondary | ICD-10-CM

## 2012-02-11 DIAGNOSIS — E669 Obesity, unspecified: Secondary | ICD-10-CM | POA: Diagnosis present

## 2012-02-11 DIAGNOSIS — K219 Gastro-esophageal reflux disease without esophagitis: Secondary | ICD-10-CM | POA: Diagnosis present

## 2012-02-11 DIAGNOSIS — M12579 Traumatic arthropathy, unspecified ankle and foot: Secondary | ICD-10-CM | POA: Diagnosis present

## 2012-02-11 HISTORY — DX: Headache: R51

## 2012-02-11 HISTORY — DX: Post-traumatic osteoarthritis, unspecified ankle and foot: M19.179

## 2012-02-11 HISTORY — DX: Dislocation of tarsometatarsal joint of unspecified foot, initial encounter: S93.326A

## 2012-02-11 HISTORY — DX: Unspecified fracture of left talus, initial encounter for closed fracture: S92.102A

## 2012-02-11 HISTORY — DX: Gastro-esophageal reflux disease without esophagitis: K21.9

## 2012-02-11 HISTORY — DX: Obesity, unspecified: E66.9

## 2012-02-11 HISTORY — DX: Other fracture of right lower leg, initial encounter for open fracture type I or II: S82.891B

## 2012-02-11 HISTORY — DX: Migraine, unspecified, not intractable, without status migrainosus: G43.909

## 2012-02-11 HISTORY — DX: Unspecified fracture of shaft of right tibia, initial encounter for open fracture type I or II: S82.201B

## 2012-02-11 HISTORY — DX: Unspecified injury of unspecified muscle, fascia and tendon at shoulder and upper arm level, unspecified arm, initial encounter: S46.909A

## 2012-02-11 HISTORY — PX: ANKLE FUSION: SHX881

## 2012-02-11 HISTORY — DX: Angina pectoris, unspecified: I20.9

## 2012-02-11 HISTORY — DX: Headache, unspecified: R51.9

## 2012-02-11 HISTORY — PX: HARDWARE REMOVAL: SHX979

## 2012-02-11 SURGERY — ARTHRODESIS ANKLE
Anesthesia: General | Site: Foot | Laterality: Right | Wound class: Clean

## 2012-02-11 MED ORDER — BUPIVACAINE HCL (PF) 0.5 % IJ SOLN
INTRAMUSCULAR | Status: DC | PRN
  Administered 2012-02-11: 30 mL

## 2012-02-11 MED ORDER — ROCURONIUM BROMIDE 100 MG/10ML IV SOLN
INTRAVENOUS | Status: DC | PRN
  Administered 2012-02-11: 50 mg via INTRAVENOUS

## 2012-02-11 MED ORDER — POTASSIUM CHLORIDE IN NACL 20-0.9 MEQ/L-% IV SOLN
INTRAVENOUS | Status: DC
  Administered 2012-02-11 – 2012-02-13 (×2): via INTRAVENOUS
  Filled 2012-02-11 (×6): qty 1000

## 2012-02-11 MED ORDER — BUPIVACAINE-EPINEPHRINE PF 0.5-1:200000 % IJ SOLN
INTRAMUSCULAR | Status: DC | PRN
  Administered 2012-02-11: 30 mL

## 2012-02-11 MED ORDER — OXYCODONE HCL 5 MG PO TABS
5.0000 mg | ORAL_TABLET | ORAL | Status: DC | PRN
  Administered 2012-02-12 – 2012-02-14 (×7): 10 mg via ORAL
  Filled 2012-02-11 (×7): qty 2

## 2012-02-11 MED ORDER — METHOCARBAMOL 100 MG/ML IJ SOLN
500.0000 mg | Freq: Four times a day (QID) | INTRAVENOUS | Status: DC | PRN
  Filled 2012-02-11: qty 5

## 2012-02-11 MED ORDER — DIPHENHYDRAMINE HCL 12.5 MG/5ML PO ELIX: 12.5000 mg | ORAL_SOLUTION | Freq: Four times a day (QID) | ORAL | Status: DC | PRN

## 2012-02-11 MED ORDER — LACTATED RINGERS IV SOLN
INTRAVENOUS | Status: DC | PRN
  Administered 2012-02-11 (×3): via INTRAVENOUS

## 2012-02-11 MED ORDER — HYDROMORPHONE 0.3 MG/ML IV SOLN
INTRAVENOUS | Status: DC
  Administered 2012-02-11: 1.8 mg via INTRAVENOUS
  Administered 2012-02-11: 1.2 mg via INTRAVENOUS
  Administered 2012-02-12: 03:00:00 via INTRAVENOUS
  Administered 2012-02-12: 1.2 mg via INTRAVENOUS
  Administered 2012-02-12: 3.3 mg via INTRAVENOUS
  Filled 2012-02-11: qty 25

## 2012-02-11 MED ORDER — MIDAZOLAM HCL 5 MG/5ML IJ SOLN
INTRAMUSCULAR | Status: DC | PRN
  Administered 2012-02-11 (×2): 1 mg via INTRAVENOUS
  Administered 2012-02-11: 2 mg via INTRAVENOUS

## 2012-02-11 MED ORDER — BUPIVACAINE HCL (PF) 0.5 % IJ SOLN
INTRAMUSCULAR | Status: AC
  Filled 2012-02-11: qty 30

## 2012-02-11 MED ORDER — PROPOFOL 10 MG/ML IV EMUL
INTRAVENOUS | Status: DC | PRN
  Administered 2012-02-11: 200 mg via INTRAVENOUS

## 2012-02-11 MED ORDER — POLYETHYLENE GLYCOL 3350 17 G PO PACK
17.0000 g | PACK | Freq: Every day | ORAL | Status: DC
Start: 2012-02-11 — End: 2012-02-14
  Administered 2012-02-11 – 2012-02-14 (×4): 17 g via ORAL
  Filled 2012-02-11 (×4): qty 1

## 2012-02-11 MED ORDER — HYDROMORPHONE HCL PF 1 MG/ML IJ SOLN
0.5000 mg | INTRAMUSCULAR | Status: DC | PRN
  Administered 2012-02-12 (×2): 1 mg via INTRAVENOUS
  Filled 2012-02-11 (×2): qty 1

## 2012-02-11 MED ORDER — DIPHENHYDRAMINE HCL 50 MG/ML IJ SOLN: 12.5000 mg | Freq: Four times a day (QID) | INTRAMUSCULAR | Status: DC | PRN

## 2012-02-11 MED ORDER — ENOXAPARIN SODIUM 40 MG/0.4ML ~~LOC~~ SOLN
40.0000 mg | SUBCUTANEOUS | Status: DC
  Administered 2012-02-12 – 2012-02-14 (×3): 40 mg via SUBCUTANEOUS
  Filled 2012-02-11 (×4): qty 0.4

## 2012-02-11 MED ORDER — HYDROMORPHONE HCL PF 1 MG/ML IJ SOLN: 0.2500 mg | INTRAMUSCULAR | Status: DC | PRN

## 2012-02-11 MED ORDER — ONDANSETRON HCL 4 MG/2ML IJ SOLN: 4.0000 mg | Freq: Four times a day (QID) | INTRAMUSCULAR | Status: DC | PRN

## 2012-02-11 MED ORDER — NALOXONE HCL 0.4 MG/ML IJ SOLN: 0.4000 mg | INTRAMUSCULAR | Status: DC | PRN

## 2012-02-11 MED ORDER — HYDROMORPHONE 0.3 MG/ML IV SOLN: INTRAVENOUS | Status: DC

## 2012-02-11 MED ORDER — BUPIVACAINE-EPINEPHRINE (PF) 0.5% -1:200000 IJ SOLN
INTRAMUSCULAR | Status: AC
  Filled 2012-02-11: qty 10

## 2012-02-11 MED ORDER — FENTANYL CITRATE 0.05 MG/ML IJ SOLN
INTRAMUSCULAR | Status: DC | PRN
  Administered 2012-02-11: 50 ug via INTRAVENOUS
  Administered 2012-02-11 (×2): 100 ug via INTRAVENOUS
  Administered 2012-02-11 (×2): 50 ug via INTRAVENOUS
  Administered 2012-02-11: 150 ug via INTRAVENOUS
  Administered 2012-02-11 (×2): 100 ug via INTRAVENOUS
  Administered 2012-02-11 (×3): 50 ug via INTRAVENOUS
  Administered 2012-02-11: 100 ug via INTRAVENOUS
  Administered 2012-02-11: 50 ug via INTRAVENOUS
  Administered 2012-02-11: 100 ug via INTRAVENOUS

## 2012-02-11 MED ORDER — ACETAMINOPHEN 10 MG/ML IV SOLN
INTRAVENOUS | Status: DC | PRN
  Administered 2012-02-11: 1000 mg via INTRAVENOUS

## 2012-02-11 MED ORDER — ONDANSETRON HCL 4 MG PO TABS: 4.0000 mg | ORAL_TABLET | Freq: Four times a day (QID) | ORAL | Status: DC | PRN

## 2012-02-11 MED ORDER — METHOCARBAMOL 500 MG PO TABS
500.0000 mg | ORAL_TABLET | Freq: Four times a day (QID) | ORAL | Status: DC | PRN
  Administered 2012-02-12: 500 mg via ORAL
  Filled 2012-02-11: qty 1

## 2012-02-11 MED ORDER — ACETAMINOPHEN 10 MG/ML IV SOLN
1000.0000 mg | Freq: Four times a day (QID) | INTRAVENOUS | Status: DC
  Administered 2012-02-11 – 2012-02-12 (×3): 1000 mg via INTRAVENOUS
  Filled 2012-02-11 (×4): qty 100

## 2012-02-11 MED ORDER — LIDOCAINE HCL (CARDIAC) 20 MG/ML IV SOLN
INTRAVENOUS | Status: DC | PRN
  Administered 2012-02-11: 40 mg via INTRAVENOUS

## 2012-02-11 MED ORDER — ACETAMINOPHEN 10 MG/ML IV SOLN
INTRAVENOUS | Status: AC
  Filled 2012-02-11: qty 100

## 2012-02-11 MED ORDER — METOCLOPRAMIDE HCL 5 MG/ML IJ SOLN
5.0000 mg | Freq: Three times a day (TID) | INTRAMUSCULAR | Status: DC | PRN
Start: 2012-02-11 — End: 2012-02-14
  Administered 2012-02-11: 10 mg via INTRAVENOUS
  Filled 2012-02-11 (×2): qty 2

## 2012-02-11 MED ORDER — ONDANSETRON HCL 4 MG/2ML IJ SOLN
INTRAMUSCULAR | Status: DC | PRN
  Administered 2012-02-11: 4 mg via INTRAVENOUS

## 2012-02-11 MED ORDER — METOCLOPRAMIDE HCL 10 MG PO TABS: 5.0000 mg | ORAL_TABLET | Freq: Three times a day (TID) | ORAL | Status: DC | PRN

## 2012-02-11 MED ORDER — HYDROMORPHONE 0.3 MG/ML IV SOLN
INTRAVENOUS | Status: AC
  Administered 2012-02-11: 14:00:00
  Filled 2012-02-11: qty 25

## 2012-02-11 MED ORDER — MORPHINE SULFATE 10 MG/ML IJ SOLN
INTRAMUSCULAR | Status: DC | PRN
  Administered 2012-02-11 (×2): 5 mg via INTRAVENOUS

## 2012-02-11 MED ORDER — BUPIVACAINE HCL (PF) 0.5 % IJ SOLN
INTRAMUSCULAR | Status: DC | PRN
  Administered 2012-02-11: 10 mL

## 2012-02-11 MED ORDER — MIDAZOLAM HCL 2 MG/2ML IJ SOLN
INTRAMUSCULAR | Status: AC
  Filled 2012-02-11: qty 2

## 2012-02-11 MED ORDER — ONDANSETRON HCL 4 MG/2ML IJ SOLN
4.0000 mg | Freq: Four times a day (QID) | INTRAMUSCULAR | Status: DC | PRN
  Administered 2012-02-11: 4 mg via INTRAVENOUS
  Filled 2012-02-11: qty 2

## 2012-02-11 MED ORDER — DOCUSATE SODIUM 100 MG PO CAPS
100.0000 mg | ORAL_CAPSULE | Freq: Two times a day (BID) | ORAL | Status: DC
  Administered 2012-02-11 – 2012-02-14 (×7): 100 mg via ORAL
  Filled 2012-02-11 (×9): qty 1

## 2012-02-11 MED ORDER — DIPHENHYDRAMINE HCL 50 MG/ML IJ SOLN
12.5000 mg | Freq: Four times a day (QID) | INTRAMUSCULAR | Status: DC | PRN
  Administered 2012-02-11 – 2012-02-12 (×2): 12.5 mg via INTRAVENOUS
  Filled 2012-02-11 (×2): qty 1

## 2012-02-11 MED ORDER — SODIUM CHLORIDE 0.9 % IJ SOLN: 9.0000 mL | INTRAMUSCULAR | Status: DC | PRN

## 2012-02-11 MED ORDER — VANCOMYCIN HCL IN DEXTROSE 1-5 GM/200ML-% IV SOLN
1000.0000 mg | Freq: Two times a day (BID) | INTRAVENOUS | Status: AC
Start: 2012-02-11 — End: 2012-02-12
  Administered 2012-02-11: 1000 mg via INTRAVENOUS
  Filled 2012-02-11 (×2): qty 200

## 2012-02-11 MED ORDER — SODIUM CHLORIDE 0.9 % IV SOLN
INTRAVENOUS | Status: DC | PRN
  Administered 2012-02-11: 09:00:00 via INTRAVENOUS

## 2012-02-11 MED ORDER — 0.9 % SODIUM CHLORIDE (POUR BTL) OPTIME
TOPICAL | Status: DC | PRN
  Administered 2012-02-11: 1000 mL

## 2012-02-11 SURGICAL SUPPLY — 92 items
BANDAGE ELASTIC 4 VELCRO ST LF (GAUZE/BANDAGES/DRESSINGS) ×2 IMPLANT
BANDAGE ELASTIC 6 VELCRO ST LF (GAUZE/BANDAGES/DRESSINGS) ×2 IMPLANT
BANDAGE ESMARK 6X9 LF (GAUZE/BANDAGES/DRESSINGS) ×1 IMPLANT
BANDAGE GAUZE ELAST BULKY 4 IN (GAUZE/BANDAGES/DRESSINGS) ×2 IMPLANT
BIT DRILL 2.5X110 QC LCP DISP (BIT) ×2 IMPLANT
BIT DRILL QC 3.5X110 (BIT) ×2 IMPLANT
BLADE LONG MED 31X9 (MISCELLANEOUS) ×2 IMPLANT
BLADE SURG 10 STRL SS (BLADE) ×2 IMPLANT
BLADE SURG 15 STRL LF DISP TIS (BLADE) ×2 IMPLANT
BLADE SURG 15 STRL SS (BLADE) ×2
BNDG COHESIVE 4X5 TAN STRL (GAUZE/BANDAGES/DRESSINGS) ×2 IMPLANT
BNDG COHESIVE 6X5 TAN STRL LF (GAUZE/BANDAGES/DRESSINGS) ×2 IMPLANT
BNDG ESMARK 6X9 LF (GAUZE/BANDAGES/DRESSINGS) ×2
BONE CANC CHIPS 20CC PCAN1/4 (Bone Implant) ×2 IMPLANT
BRUSH SCRUB DISP (MISCELLANEOUS) ×4 IMPLANT
CHIPS CANC BONE 20CC PCAN1/4 (Bone Implant) ×1 IMPLANT
CLEANER TIP ELECTROSURG 2X2 (MISCELLANEOUS) ×2 IMPLANT
CLOTH BEACON ORANGE TIMEOUT ST (SAFETY) ×2 IMPLANT
COVER MAYO STAND STRL (DRAPES) ×2 IMPLANT
COVER SURGICAL LIGHT HANDLE (MISCELLANEOUS) ×4 IMPLANT
CUFF TOURNIQUET SINGLE 18IN (TOURNIQUET CUFF) IMPLANT
CUFF TOURNIQUET SINGLE 24IN (TOURNIQUET CUFF) IMPLANT
CUFF TOURNIQUET SINGLE 34IN LL (TOURNIQUET CUFF) IMPLANT
DRAPE C-ARM 42X72 X-RAY (DRAPES) ×2 IMPLANT
DRAPE C-ARMOR (DRAPES) ×2 IMPLANT
DRAPE OEC MINIVIEW 54X84 (DRAPES) ×2 IMPLANT
DRAPE ORTHO SPLIT 77X108 STRL (DRAPES) ×1
DRAPE SURG ORHT 6 SPLT 77X108 (DRAPES) ×1 IMPLANT
DRAPE U-SHAPE 47X51 STRL (DRAPES) ×2 IMPLANT
DRSG ADAPTIC 3X8 NADH LF (GAUZE/BANDAGES/DRESSINGS) ×2 IMPLANT
DRSG EMULSION OIL 3X3 NADH (GAUZE/BANDAGES/DRESSINGS) ×2 IMPLANT
DRSG PAD ABDOMINAL 8X10 ST (GAUZE/BANDAGES/DRESSINGS) ×2 IMPLANT
ELECT REM PT RETURN 9FT ADLT (ELECTROSURGICAL) ×2
ELECTRODE REM PT RTRN 9FT ADLT (ELECTROSURGICAL) ×1 IMPLANT
EVACUATOR 1/8 PVC DRAIN (DRAIN) IMPLANT
GLOVE BIO SURGEON STRL SZ7.5 (GLOVE) ×2 IMPLANT
GLOVE BIO SURGEON STRL SZ8 (GLOVE) ×2 IMPLANT
GLOVE BIOGEL PI IND STRL 7.5 (GLOVE) ×1 IMPLANT
GLOVE BIOGEL PI IND STRL 8 (GLOVE) ×1 IMPLANT
GLOVE BIOGEL PI INDICATOR 7.5 (GLOVE) ×1
GLOVE BIOGEL PI INDICATOR 8 (GLOVE) ×1
GOWN PREVENTION PLUS XLARGE (GOWN DISPOSABLE) ×2 IMPLANT
GOWN STRL NON-REIN LRG LVL3 (GOWN DISPOSABLE) ×6 IMPLANT
K-WIRE 1.25 TRCR POINT 150 (WIRE) ×2
K-WIRE 2.0X150M (WIRE) ×8
KIT BASIN OR (CUSTOM PROCEDURE TRAY) ×2 IMPLANT
KIT ROOM TURNOVER OR (KITS) ×2 IMPLANT
KWIRE 1.25 TRCR POINT 150 (WIRE) ×1 IMPLANT
KWIRE 2.0X150M (WIRE) ×4 IMPLANT
MANIFOLD NEPTUNE II (INSTRUMENTS) ×2 IMPLANT
NEEDLE 22X1 1/2 (OR ONLY) (NEEDLE) ×2 IMPLANT
NS IRRIG 1000ML POUR BTL (IV SOLUTION) ×2 IMPLANT
PACK ORTHO EXTREMITY (CUSTOM PROCEDURE TRAY) ×2 IMPLANT
PAD ARMBOARD 7.5X6 YLW CONV (MISCELLANEOUS) ×4 IMPLANT
PAD CAST 4YDX4 CTTN HI CHSV (CAST SUPPLIES) ×2 IMPLANT
PADDING CAST COTTON 4X4 STRL (CAST SUPPLIES) ×2
PADDING CAST COTTON 6X4 STRL (CAST SUPPLIES) ×6 IMPLANT
PENCIL BUTTON HOLSTER BLD 10FT (ELECTRODE) ×2 IMPLANT
PLATE ANTERIOR ANKLE FUSION 6H (Plate) ×2 IMPLANT
SCREW CORTEX 3.5 32MM (Screw) ×1 IMPLANT
SCREW CORTEX 3.5 45MM (Screw) ×2 IMPLANT
SCREW CORTEX ST 4.5X22 (Screw) IMPLANT
SCREW CORTEX ST 4.5X28 (Screw) ×2 IMPLANT
SCREW CORTEX ST 4.5X30 (Screw) ×2 IMPLANT
SCREW CORTEX ST 4.5X34 (Screw) IMPLANT
SCREW CORTEX ST 4.5X42 (Screw) ×2 IMPLANT
SCREW CORTEX ST 4.5X66 (Screw) ×2 IMPLANT
SCREW LOCK CORT ST 3.5X32 (Screw) ×1 IMPLANT
SPONGE GAUZE 4X4 12PLY (GAUZE/BANDAGES/DRESSINGS) ×2 IMPLANT
SPONGE LAP 18X18 X RAY DECT (DISPOSABLE) ×6 IMPLANT
SPONGE SCRUB IODOPHOR (GAUZE/BANDAGES/DRESSINGS) ×2 IMPLANT
STAPLER VISISTAT 35W (STAPLE) IMPLANT
STOCKINETTE IMPERVIOUS LG (DRAPES) ×2 IMPLANT
STRIP CLOSURE SKIN 1/2X4 (GAUZE/BANDAGES/DRESSINGS) IMPLANT
SUCTION FRAZIER TIP 10 FR DISP (SUCTIONS) ×2 IMPLANT
SUT ETHILON 3 0 PS 1 (SUTURE) ×4 IMPLANT
SUT PDS AB 2-0 CT1 27 (SUTURE) IMPLANT
SUT VIC AB 0 CT1 27 (SUTURE)
SUT VIC AB 0 CT1 27XBRD ANBCTR (SUTURE) IMPLANT
SUT VIC AB 2-0 CT1 27 (SUTURE) ×1
SUT VIC AB 2-0 CT1 TAPERPNT 27 (SUTURE) ×1 IMPLANT
SUT VIC AB 2-0 CT3 27 (SUTURE) IMPLANT
SUT VIC AB 2-0 SH 18 (SUTURE) ×2 IMPLANT
SUT VIC AB 3-0 FS2 27 (SUTURE) ×2 IMPLANT
SYR CONTROL 10ML LL (SYRINGE) ×2 IMPLANT
SYSTEM CHEST DRAIN TLS 7FR (DRAIN) ×2 IMPLANT
TOWEL OR 17X24 6PK STRL BLUE (TOWEL DISPOSABLE) ×4 IMPLANT
TOWEL OR 17X26 10 PK STRL BLUE (TOWEL DISPOSABLE) ×4 IMPLANT
TUBE CONNECTING 12X1/4 (SUCTIONS) ×2 IMPLANT
UNDERPAD 30X30 INCONTINENT (UNDERPADS AND DIAPERS) ×2 IMPLANT
WATER STERILE IRR 1000ML POUR (IV SOLUTION) ×4 IMPLANT
YANKAUER SUCT BULB TIP NO VENT (SUCTIONS) ×2 IMPLANT

## 2012-02-11 NOTE — Brief Op Note (Signed)
02/11/2012  1:36 PM  PATIENT:  [name]  21 y.o. female  PRE-OPERATIVE DIAGNOSIS:  POST TRAUMATIC RIGHT ANKLE ARTHRITIS, syndesmosis nonunion, symptomatic hardware, broken hardware  POST-OPERATIVE DIAGNOSIS:  POST TRAUMATIC RIGHT ANKLE ARTHRITIS, syndesmosis nonunion, symptomatic hardware, broken hardware   PROCEDURE:  Procedure(s) (LRB): 1. ARTHRODESIS ANKLE (Right) 2. Fibular osteotomy and syndesmotic fusion 3. HARDWARE REMOVAL (Right) fibula  4. Hardware removal of tibial nail  SURGEON:  Surgeon(s) and Role:    * Budd Palmer, MD - Primary  PHYSICIAN ASSISTANT: Montez Morita, [facility]  ANESTHESIA:   general 77 EBL:  Total I/O In: 2500 [I.V.:2500] Out: 600 [Urine:500; Blood:100]  BLOOD ADMINISTERED:none  DRAINS: none   LOCAL MEDICATIONS USED:  MARCAINE     SPECIMEN:  No Specimen  DISPOSITION OF SPECIMEN:  N/A  COUNTS:  YES  TOURNIQUET:   Total Tourniquet Time Documented: Thigh (Right) - 133 minutes  DICTATION: #[id number]  PLAN OF CARE: Admit to inpatient   PATIENT DISPOSITION:  PACU - hemodynamically stable.   Delay start of Pharmacological VTE agent (>24hrs) due to surgical blood loss or risk of bleeding: no

## 2012-02-11 NOTE — Anesthesia Procedure Notes (Addendum)
Anesthesia Regional Block:  Popliteal block  Pre-Anesthetic Checklist: ,, timeout performed, Correct Patient, Correct Site, Correct Laterality, Correct Procedure, Correct Position, site marked, Risks and benefits discussed, pre-op evaluation, post-op pain management  Laterality: Right  Prep: Maximum Sterile Barrier Precautions used and chloraprep       Needles:  Injection technique: Single-shot  Needle Type: Echogenic Stimulator Needle          Additional Needles:  Procedures: ultrasound guided and nerve stimulator Popliteal block  Nerve Stimulator or Paresthesia:  Response: Peroneal,  Response: Tibial, 0.4 mA,   Additional Responses:   Narrative:  Start time: 02/11/2012 8:32 AM End time: 02/11/2012 8:45 AM Injection made incrementally with aspirations every 5 mL. Anesthesiologist: Sampson Goon, MD  Additional Notes: 2% Lidocaine skin wheel. Saphenous block above the ankle with 10cc of 0.5% Bupivicaine plain.  Popliteal block Procedure Name: Intubation Date/Time: 02/11/2012 9:00 AM Performed by: Arlice Colt B Pre-anesthesia Checklist: Patient identified, Emergency Drugs available, Suction available, Patient being monitored and Timeout performed Patient Re-evaluated:Patient Re-evaluated prior to inductionOxygen Delivery Method: Circle system utilized Preoxygenation: Pre-oxygenation with 100% oxygen Intubation Type: IV induction Ventilation: Mask ventilation without difficulty Laryngoscope Size: Mac and 3 Grade View: Grade I Tube type: Oral Tube size: 7.5 mm Number of attempts: 1 Airway Equipment and Method: Stylet Placement Confirmation: ETT inserted through vocal cords under direct vision,  positive ETCO2 and breath sounds checked- equal and bilateral Secured at: 21 cm Tube secured with: Tape Dental Injury: Teeth and Oropharynx as per pre-operative assessment

## 2012-02-11 NOTE — Anesthesia Preprocedure Evaluation (Addendum)
Anesthesia Evaluation  Patient identified by MRN, date of birth, ID band Patient awake    Reviewed: Allergy & Precautions, H&P , NPO status , Patient's Chart, lab work & pertinent test results  Airway Mallampati: II TM Distance: >3 FB Neck ROM: Full    Dental No notable dental hx. (+) Teeth Intact and Dental Advisory Given   Pulmonary Current Smoker,  breath sounds clear to auscultation  Pulmonary exam normal       Cardiovascular negative cardio ROS  Rhythm:Regular Rate:Normal     Neuro/Psych negative neurological ROS  negative psych ROS   GI/Hepatic negative GI ROS, Neg liver ROS,   Endo/Other  negative endocrine ROS  Renal/GU negative Renal ROS  negative genitourinary   Musculoskeletal   Abdominal (+) + obese,   Peds  Hematology negative hematology ROS (+)   Anesthesia Other Findings   Reproductive/Obstetrics negative OB ROS                          Anesthesia Physical Anesthesia Plan  ASA: II  Anesthesia Plan: General   Post-op Pain Management:    Induction: Intravenous  Airway Management Planned: Oral ETT  Additional Equipment:   Intra-op Plan:   Post-operative Plan: Extubation in OR  Informed Consent: I have reviewed the patients History and Physical, chart, labs and discussed the procedure including the risks, benefits and alternatives for the proposed anesthesia with the patient or authorized representative who has indicated his/her understanding and acceptance.   Dental advisory given  Plan Discussed with: CRNA and Surgeon  Anesthesia Plan Comments:      Anesthesia Quick Evaluation

## 2012-02-11 NOTE — Transfer of Care (Signed)
Immediate Anesthesia Transfer of Care Note  Patient: [name]  Procedure(s) Performed: Procedure(s) (LRB): ARTHRODESIS ANKLE (Right) HARDWARE REMOVAL (Right)  Patient Location: PACU  Anesthesia Type: General and Regional  Level of Consciousness: awake, alert , oriented and patient cooperative  Airway & Oxygen Therapy: Patient Spontanous Breathing and Patient connected to nasal cannula oxygen  Post-op Assessment: Report given to PACU RN and Post -op Vital signs reviewed and stable  Post vital signs: Reviewed and stable  Complications: No apparent anesthesia complications

## 2012-02-11 NOTE — Progress Notes (Signed)
Pt c/o pain in RUE.  Hand and right fingers appear a little puffy. 2+ r rad pulse, cap refill < 3 sec.  Dr Sampson Goon notified and here to speak with pt. Pt will cont to monitor over next few days. No new orders.

## 2012-02-11 NOTE — Anesthesia Postprocedure Evaluation (Signed)
  Anesthesia Post-op Note  Patient: [name]  Procedure(s) Performed: Procedure(s) (LRB): ARTHRODESIS ANKLE (Right) HARDWARE REMOVAL (Right)  Patient Location: PACU  Anesthesia Type: GA combined with regional for post-op pain  Level of Consciousness: awake  Airway and Oxygen Therapy: Patient Spontanous Breathing and Patient connected to nasal cannula oxygen  Post-op Pain: none  Post-op Assessment: Post-op Vital signs reviewed, Patient's Cardiovascular Status Stable, Respiratory Function Stable, Patent Airway and No signs of Nausea or vomiting  Post-op Vital Signs: Reviewed and stable  Complications: No apparent anesthesia complications

## 2012-02-11 NOTE — H&P (Signed)
I have seen and examined the patient. I agree with the findings above. I discussed with the patient the risks and benefits of surgery, including the possibility of infection, nerve injury, vessel injury, wound breakdown, failure of fusion, symptomatic hardware, DVT/ PE, loss of motion, and need for further surgery among others.  She understands these risks and wishes to proceed.  Budd Palmer, MD 02/11/2012 8:06 AM

## 2012-02-11 NOTE — Progress Notes (Signed)
Pt c/o discomfort at r lip area.  It appears a little swollen.  Ice to site. Dr Sampson Goon here and aware.

## 2012-02-11 NOTE — Progress Notes (Signed)
Orthopedic Tech Progress Note Patient Details:  [name] 09-27-1990 [id number]  Patient ID: [name], female   DOB: [birth year], 21 y.o.   MRN: [id number]   [name] 02/11/2012, 5:16 PM Trapeze bar

## 2012-02-11 NOTE — Plan of Care (Signed)
Problem: Diagnosis - Type of Surgery Goal: General Surgical Patient Education (See Patient Education module for education specifics) R ankle arthodesis with hardware removal

## 2012-02-12 ENCOUNTER — Encounter (HOSPITAL_COMMUNITY): Payer: Self-pay | Admitting: Orthopedic Surgery

## 2012-02-12 DIAGNOSIS — K219 Gastro-esophageal reflux disease without esophagitis: Secondary | ICD-10-CM | POA: Diagnosis present

## 2012-02-12 DIAGNOSIS — E669 Obesity, unspecified: Secondary | ICD-10-CM

## 2012-02-12 DIAGNOSIS — M19179 Post-traumatic osteoarthritis, unspecified ankle and foot: Secondary | ICD-10-CM | POA: Diagnosis present

## 2012-02-12 DIAGNOSIS — R000 Tachycardia, unspecified: Secondary | ICD-10-CM

## 2012-02-12 HISTORY — DX: Obesity, unspecified: E66.9

## 2012-02-12 HISTORY — DX: Post-traumatic osteoarthritis, unspecified ankle and foot: M19.179

## 2012-02-12 LAB — BASIC METABOLIC PANEL
BUN: 8 mg/dL (ref 6–23)
CO2: 27 mEq/L (ref 19–32)
Calcium: 8.6 mg/dL (ref 8.4–10.5)
Glucose, Bld: 118 mg/dL — ABNORMAL HIGH (ref 70–99)
Sodium: 137 mEq/L (ref 135–145)

## 2012-02-12 LAB — CBC
HCT: 31.3 % — ABNORMAL LOW (ref 36.0–46.0)
Hemoglobin: 10.4 g/dL — ABNORMAL LOW (ref 12.0–15.0)
MCH: 30.2 pg (ref 26.0–34.0)
MCV: 91 fL (ref 78.0–100.0)
RBC: 3.44 MIL/uL — ABNORMAL LOW (ref 3.87–5.11)

## 2012-02-12 MED ORDER — ACETAMINOPHEN 325 MG PO TABS
325.0000 mg | ORAL_TABLET | Freq: Four times a day (QID) | ORAL | Status: DC | PRN
  Administered 2012-02-12: 325 mg via ORAL
  Administered 2012-02-13: 650 mg via ORAL
  Filled 2012-02-12 (×2): qty 2

## 2012-02-12 MED ORDER — METHOCARBAMOL 100 MG/ML IJ SOLN
1000.0000 mg | Freq: Four times a day (QID) | INTRAVENOUS | Status: DC
  Administered 2012-02-12: 1000 mg via INTRAVENOUS
  Filled 2012-02-12 (×12): qty 10

## 2012-02-12 MED ORDER — PREGABALIN 50 MG PO CAPS
75.0000 mg | ORAL_CAPSULE | Freq: Two times a day (BID) | ORAL | Status: DC
  Administered 2012-02-12 – 2012-02-14 (×5): 75 mg via ORAL
  Filled 2012-02-12: qty 2
  Filled 2012-02-12 (×4): qty 1

## 2012-02-12 MED ORDER — OXYCODONE-ACETAMINOPHEN 5-325 MG PO TABS
1.0000 | ORAL_TABLET | Freq: Four times a day (QID) | ORAL | Status: DC | PRN
  Administered 2012-02-12 – 2012-02-13 (×3): 1 via ORAL
  Administered 2012-02-13 – 2012-02-14 (×2): 2 via ORAL
  Filled 2012-02-12 (×2): qty 1
  Filled 2012-02-12: qty 2
  Filled 2012-02-12: qty 1
  Filled 2012-02-12: qty 2
  Filled 2012-02-12: qty 1

## 2012-02-12 MED ORDER — HYDROXYZINE HCL 50 MG PO TABS
50.0000 mg | ORAL_TABLET | Freq: Three times a day (TID) | ORAL | Status: DC
  Administered 2012-02-12 – 2012-02-14 (×7): 50 mg via ORAL
  Filled 2012-02-12 (×12): qty 1

## 2012-02-12 MED ORDER — MORPHINE SULFATE 2 MG/ML IJ SOLN
2.0000 mg | INTRAMUSCULAR | Status: DC | PRN
  Administered 2012-02-12 – 2012-02-13 (×6): 2 mg via INTRAVENOUS
  Filled 2012-02-12 (×6): qty 1

## 2012-02-12 MED ORDER — METHOCARBAMOL 500 MG PO TABS
1000.0000 mg | ORAL_TABLET | Freq: Four times a day (QID) | ORAL | Status: DC
  Administered 2012-02-12 – 2012-02-14 (×8): 1000 mg via ORAL
  Filled 2012-02-12 (×12): qty 2

## 2012-02-12 MED ORDER — OXYCODONE HCL 5 MG PO TABS
5.0000 mg | ORAL_TABLET | Freq: Four times a day (QID) | ORAL | Status: DC | PRN
  Administered 2012-02-12 – 2012-02-13 (×3): 5 mg via ORAL
  Administered 2012-02-14: 10 mg via ORAL
  Filled 2012-02-12: qty 2
  Filled 2012-02-12 (×3): qty 1

## 2012-02-12 NOTE — Care Management Note (Addendum)
    Page 1 of 2   02/14/2012     11:00:03 AM   CARE MANAGEMENT NOTE 02/14/2012  Patient:  RAFEEF, [name]   Account Number:  [id number]  Date Initiated:  02/12/2012  Documentation initiated by:  Anette Guarneri  Subjective/Objective Assessment:   POD#1 s/p removal hardware  Lives at home with family  NWB on RLE     Action/Plan:   home with [facility] services   Anticipated DC Date:  02/14/2012   Anticipated DC Plan:  HOME W HOME HEALTH SERVICES      DC Planning Services  CM consult      [facility] Choice  HOME HEALTH   Choice offered to / List presented to:  C-6 Parent   DME arranged  HOSPITAL BED  WALKER - ROLLING  WHEELCHAIR - MANUAL      DME agency  [facility]     HH arranged  HH-2 PT  HH-3 OT      [facility] agency  [facility]   Status of service:  In process, will continue to follow Medicare Important Message given?  NO (If response is "NO", the following Medicare IM given date fields will be blank) Date Medicare IM given:   Date Additional Medicare IM given:    Discharge Disposition:  HOME W HOME HEALTH SERVICES  Per UR Regulation:  Reviewed for med. necessity/level of care/duration of stay  If discussed at Long Length of Stay Meetings, dates discussed:    Comments:  02/14/12  10:58 Anette Guarneri RN/CM Family request wheelchair(they thought they had one but didn't), requested manual wc with elevated leg rest and removable arms to be delivered to room prior to discharge.    02/13/12 15:00 Anette Guarneri RN/CM Spoke with patient's mother, per mother they do want hospital bed and RW, mother again confirmed that a wheelchair is not needed and that patient does have BSC. Mothers choice for DME is [facility] Contacted [facility], requested Hospital bed and RW, bed to be delivered to patient's home when discharged.   02/12/12  14:49  Anette Guarneri RN/CM spoke with patient's mother and patient, per mother Ms. Jerez has a twin bed downstairs at home, already  has WC with elevated legs, she is not sure if arm are removable but per mother patient was transferring from bed to current wc prior to admission, per mother patient also already has BSC and shower seat. Per mother/patient used [facility] for prior [facility] serivces and that is mother/patient's choice for this admission [facility] contacted for HHPT/OT after discharge.   02/12/12  11:51  Anette Guarneri RN/CM PT recommends Hospital bed, WC with removable elevated leg rest and removable arm rest Benefit check requested. per Benefits check-hospital bed not covered, WC will be covered with documentation of medical necessicity.
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
## 2012-02-12 NOTE — Progress Notes (Signed)
Pt. Temp was 102. Dr. Carola Frost was notified. Incentive Spirometer encouraged, will continue to monitor.

## 2012-02-12 NOTE — Progress Notes (Signed)
UR COMPLETED  

## 2012-02-12 NOTE — Evaluation (Signed)
Physical Therapy Evaluation Patient Details Name: [name] MRN: [id number] DOB: Jul 29, 1990 Today's Date: 02/12/2012 Time: 0840-0900 PT Time Calculation (min): 20 min  PT Assessment / Plan / Recommendation Clinical Impression  Pt is 21 y/o female admitted for s/p right ankle ORIF hardware removal.  Pt limited by pain and overall fatigue.  Pt will benefit from acute PT services to improve overall mobility and prepare for safe d/c home with assistance.      PT Assessment  Patient needs continued PT services    Follow Up Recommendations  Home health PT    Barriers to Discharge        Equipment Recommendations  Rolling walker with 5" wheels;Wheelchair (measurements);Wheelchair cushion (measurements);Hospital bed    Recommendations for Other Services     Frequency Min 5X/week    Precautions / Restrictions Restrictions Weight Bearing Restrictions: Yes RLE Weight Bearing: Non weight bearing   Pertinent Vitals/Pain 8/10 right ankle throbbing      Mobility  Bed Mobility Bed Mobility: Sit to Supine Supine to Sit: 3: Mod assist Details for Bed Mobility Assistance: A for lifting RLE. limited by pain Transfers Sit to Stand: 1: +2 Total assist;From chair/3-in-1;With upper extremity assist Sit to Stand: Patient Percentage: 70% Stand to Sit: 1: +2 Total assist;With upper extremity assist;To bed Stand to Sit: Patient Percentage: 70% Details for Transfer Assistance: states L ankle bothers her during transfer, but pt did well    Exercises     PT Diagnosis:    PT Problem List: Decreased strength;Decreased activity tolerance;Decreased balance;Decreased mobility;Decreased knowledge of use of DME;Pain;Decreased knowledge of precautions PT Treatment Interventions: DME instruction;Gait training;Stair training;Functional mobility training;Therapeutic activities;Therapeutic exercise;Balance training;Patient/family education;Wheelchair mobility training   PT Goals Acute Rehab PT  Goals PT Goal Formulation: With patient/family Time For Goal Achievement: 02/19/12 Potential to Achieve Goals: Good Pt will go Supine/Side to Sit: with modified independence PT Goal: Supine/Side to Sit - Progress: Goal set today Pt will go Sit to Stand: with modified independence PT Goal: Sit to Stand - Progress: Goal set today Pt will Transfer Bed to Chair/Chair to Bed: with modified independence PT Transfer Goal: Bed to Chair/Chair to Bed - Progress: Goal set today Pt will Stand: with min assist;3 - 5 min PT Goal: Stand - Progress: Goal set today Pt will Go Up / Down Stairs: 3-5 stairs;with +2 total assist;with other equipment (comment) (Family able to use w/c to back pt upstairs) PT Goal: Up/Down Stairs - Progress: Goal set today Pt will Propel Wheelchair: 10 - 50 feet;with modified independence PT Goal: Propel Wheelchair - Progress: Goal set today  Visit Information  Last PT Received On: 02/12/12 Assistance Needed: +2 (safety)    Subjective Data  Subjective: "I can't hop on my left." Patient Stated Goal: To return home.   Prior Functioning  Home Living Lives With: Family Available Help at Discharge: Family;Friend(s) Type of Home: House Home Access: Stairs to enter [employer] of Steps: 4 Home Layout: Two level;1/2 bath on main level Alternate Level Stairs-Number of Steps: 14 Bathroom Shower/Tub: Tub/shower unit;Curtain Bathroom Toilet: Standard Bathroom Accessibility: No Home Adaptive Equipment: Bedside commode/3-in-1;Crutches;Tub transfer bench Additional Comments: P has friends who can build ramp if needed. Prior Function Level of Independence: Independent with assistive device(s) Able to Take Stairs?: Yes Driving: No Vocation: Unemployed Communication Communication: No difficulties Dominant Hand: Right    Cognition  Overall Cognitive Status: Appears within functional limits for tasks assessed/performed Arousal/Alertness: Lethargic (apparently due to  meds) Orientation Level: Appears intact for tasks  assessed Behavior During Session: Anxious    Extremity/Trunk Assessment Right Upper Extremity Assessment RUE ROM/Strength/Tone: Deficits;Within functional levels RUE ROM/Strength/Tone Deficits: apparent ring finger FPL deficit RUE Sensation: WFL - Light Touch;WFL - Proprioception RUE Coordination: WFL - gross/fine motor Left Upper Extremity Assessment LUE ROM/Strength/Tone: Within functional levels LUE Sensation: WFL - Light Touch;WFL - Proprioception LUE Coordination: WFL - gross/fine motor Trunk Assessment Trunk Assessment: Normal   Balance    End of Session PT - End of Session Equipment Utilized During Treatment: Gait belt Activity Tolerance: Patient limited by pain Patient left: in chair;with call bell/phone within reach Nurse Communication: Mobility status  GP     [name] 02/12/2012, 2:56 PM Jake Shark, PT DPT [phone]

## 2012-02-13 ENCOUNTER — Encounter (HOSPITAL_COMMUNITY): Payer: Self-pay | Admitting: General Practice

## 2012-02-13 DIAGNOSIS — M1990 Unspecified osteoarthritis, unspecified site: Secondary | ICD-10-CM

## 2012-02-13 HISTORY — DX: Unspecified osteoarthritis, unspecified site: M19.90

## 2012-02-13 NOTE — Progress Notes (Signed)
I have seen and examined the patient. I agree with the findings above.  Budd Palmer, MD 02/13/2012 11:26 AM

## 2012-02-13 NOTE — Progress Notes (Signed)
Physical Therapy Treatment Patient Details Name: [name] MRN: [id number] DOB: August 01, 1990 Today's Date: 02/13/2012 Time: 4098-1191 PT Time Calculation (min): 21 min  PT Assessment / Plan / Recommendation Comments on Treatment Session  Pt able to tolerate more this session and able to complete transfer. Pt with improved overall mobility and decrease pain.    Follow Up Recommendations  No PT follow up    Barriers to Discharge        Equipment Recommendations  Wheelchair (measurements);Wheelchair cushion (measurements);Hospital bed    Recommendations for Other Services    Frequency Min 5X/week   Plan Discharge plan remains appropriate;Frequency remains appropriate    Precautions / Restrictions Restrictions Weight Bearing Restrictions: Yes RLE Weight Bearing: Non weight bearing   Pertinent Vitals/Pain C/o 9/10 right ankle pain    Mobility  Bed Mobility Bed Mobility: Sit to Supine Supine to Sit: 6: Modified independent (Device/Increase time) Transfers Transfers: Sit to Stand;Stand to [employer] Transfers Sit to Stand: 5: Supervision Stand to Sit: 5: Supervision Stand Pivot Transfers: 5: Supervision Details for Transfer Assistance: Supervision for safety with cues for hand placement and proper technique.  Pt able to maintian right NWB. Ambulation/Gait Ambulation/Gait Assistance: Not tested (comment)    Exercises     PT Diagnosis:    PT Problem List:   PT Treatment Interventions:     PT Goals Acute Rehab PT Goals PT Goal Formulation: With patient/family Time For Goal Achievement: 02/19/12 Potential to Achieve Goals: Good Pt will go Supine/Side to Sit: with modified independence PT Goal: Supine/Side to Sit - Progress: Met Pt will Transfer Bed to Chair/Chair to Bed: with modified independence PT Transfer Goal: Bed to Chair/Chair to Bed - Progress: Progressing toward goal Pt will Stand: with min assist;3 - 5 min PT Goal: Stand - Progress: Progressing toward  goal  Visit Information  Last PT Received On: 02/13/12 Assistance Needed: +1 PT/OT Co-Evaluation/Treatment: Yes    Subjective Data      Cognition  Overall Cognitive Status: Appears within functional limits for tasks assessed/performed Arousal/Alertness: Awake/alert Orientation Level: Appears intact for tasks assessed Behavior During Session: [facility] for tasks performed    Balance     End of Session PT - End of Session Equipment Utilized During Treatment: Gait belt Activity Tolerance: Patient tolerated treatment well Patient left: in chair;with call bell/phone within reach Nurse Communication: Mobility status   GP     [name] 02/13/2012, 3:34 PM Jake Shark, PT DPT [phone]
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
## 2012-02-14 MED ORDER — OXYCODONE-ACETAMINOPHEN 5-325 MG PO TABS: 1.0000 | ORAL_TABLET | Freq: Four times a day (QID) | ORAL | Status: AC | PRN

## 2012-02-14 MED ORDER — METHOCARBAMOL 500 MG PO TABS: 500.0000 mg | ORAL_TABLET | Freq: Four times a day (QID) | ORAL | Status: AC

## 2012-02-14 MED ORDER — OXYCODONE HCL 5 MG PO TABS: 5.0000 mg | ORAL_TABLET | ORAL | Status: AC | PRN

## 2012-02-14 MED ORDER — ASPIRIN EC 325 MG PO TBEC: 325.0000 mg | DELAYED_RELEASE_TABLET | Freq: Every day | ORAL | Status: AC

## 2012-02-14 MED ORDER — DSS 100 MG PO CAPS: 100.0000 mg | ORAL_CAPSULE | Freq: Two times a day (BID) | ORAL | Status: AC

## 2012-02-14 NOTE — Progress Notes (Signed)
Physical Therapy Treatment Patient Details Name: [name] MRN: [id number] DOB: [birth year] Today's Date: 02/14/2012 Time: 1000-1015 PT Time Calculation (min): 15 min  PT Assessment / Plan / Recommendation Comments on Treatment Session  Pt did not want to pratice getting in/out bed.  However mother called while PT in room and now needs w/c for home because the one at home does not have elevated leg rest or drop arm.  Spent several minutes on the phone with mother reviewing home needs and making sure no other equipment needed.  Then notified CM for the change in DME needs.  Pt had no further question for PT and refused practicing getting in/out.  Pt stated "I've delt with this before."    Follow Up Recommendations  No PT follow up    Barriers to Discharge        Equipment Recommendations  Wheelchair (measurements);Wheelchair cushion (measurements);Hospital bed    Recommendations for Other Services    Frequency Min 5X/week   Plan Discharge plan remains appropriate;Frequency remains appropriate    Precautions / Restrictions Restrictions Weight Bearing Restrictions: Yes RLE Weight Bearing: Non weight bearing   Pertinent Vitals/Pain No c/o pain    Mobility       Exercises     PT Diagnosis:    PT Problem List:   PT Treatment Interventions:     PT Goals Acute Rehab PT Goals PT Goal Formulation: With patient/family Time For Goal Achievement: 02/19/12 Potential to Achieve Goals: Good  Visit Information  Last PT Received On: 02/14/12 Assistance Needed: +1    Subjective Data  Subjective: "I don't need to practice anymore.  I got everything I need." Patient Stated Goal: To return home.   Cognition  Overall Cognitive Status: Appears within functional limits for tasks assessed/performed Arousal/Alertness: Awake/alert Orientation Level: Appears intact for tasks assessed Behavior During Session: [facility] for tasks performed    Balance     End of Session PT - End of  Session Patient left: in bed;with call bell/phone within reach;with family/visitor present Nurse Communication: Mobility status   GP     [name] 02/14/2012, 10:24 AM
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
## 2012-06-20 IMAGING — RF DG TIBIA/FIBULA 2V*R*
1 series · 9 of 9 positions shown · non-contrast
Comparison: CT 01/21/2011.

CLINICAL DATA: Right tibial intramedullary nail placement.

RIGHT TIBIA AND FIBULA - 2 VIEW

[Series 1: run · 9 of 9 slices shown]
[im 1/9]
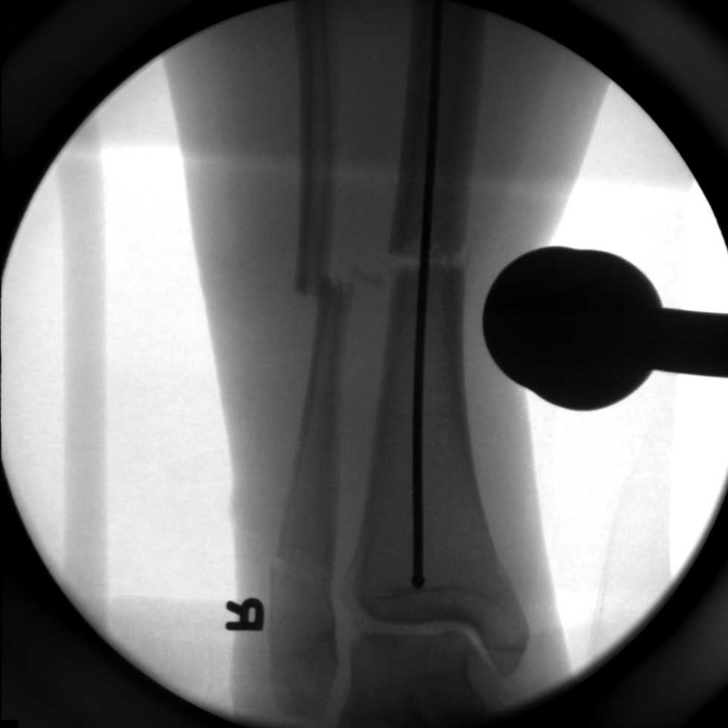
[im 2/9]
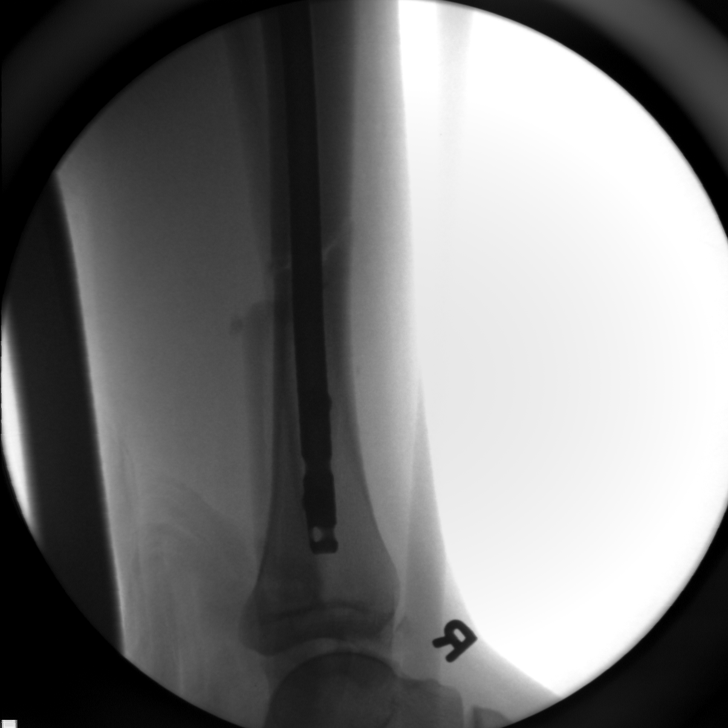
[im 3/9]
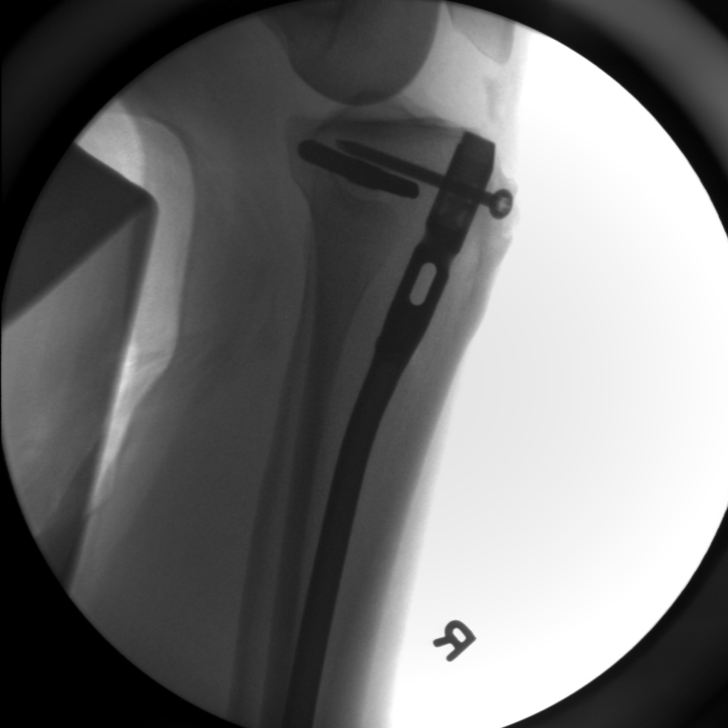
[im 4/9]
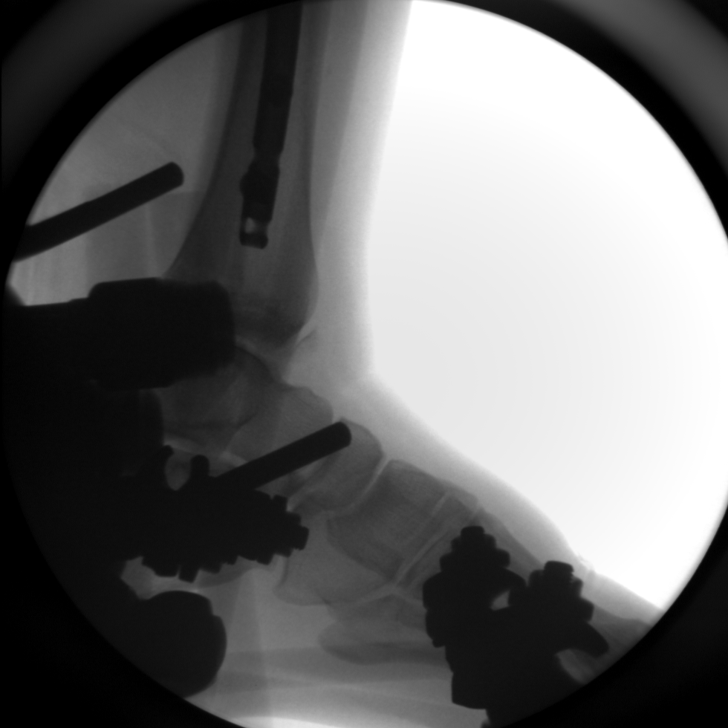
[im 5/9]
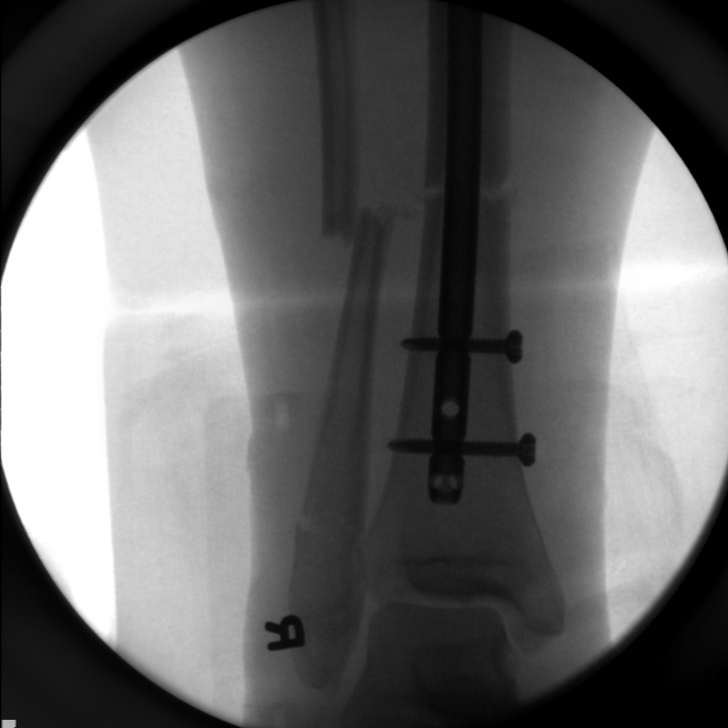
[im 6/9]
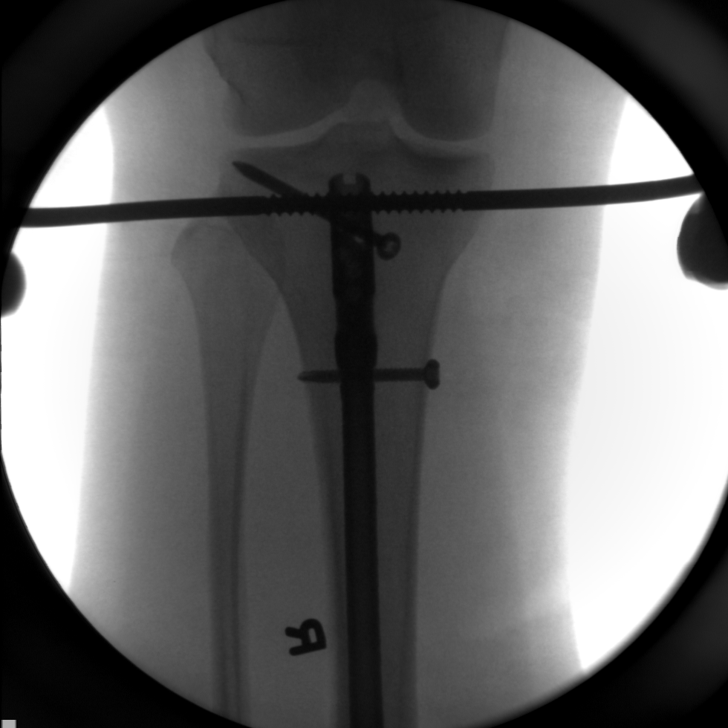
[im 7/9]
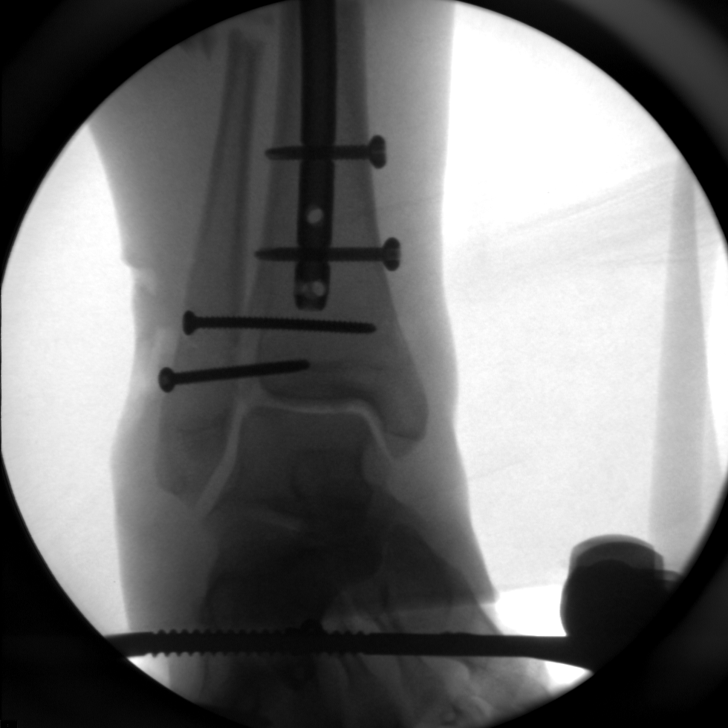
[im 8/9]
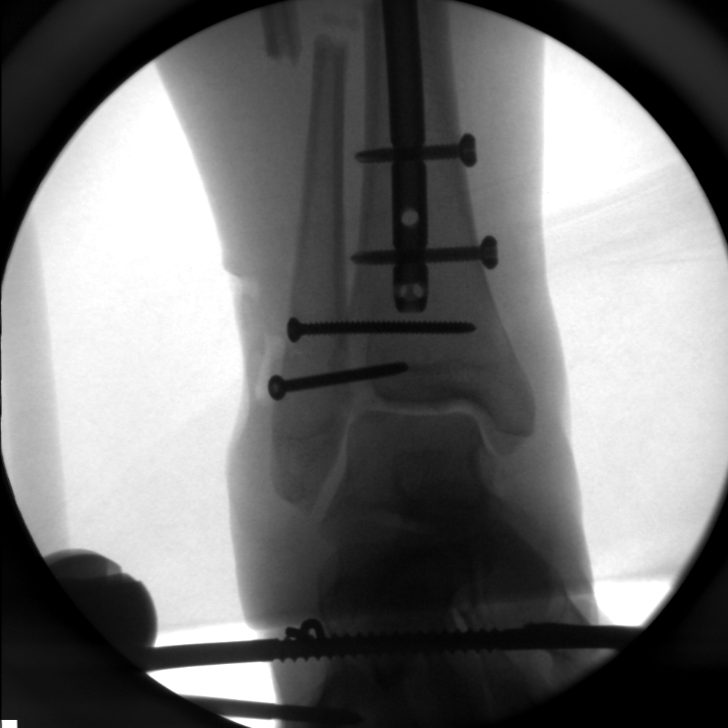
[im 9/9]
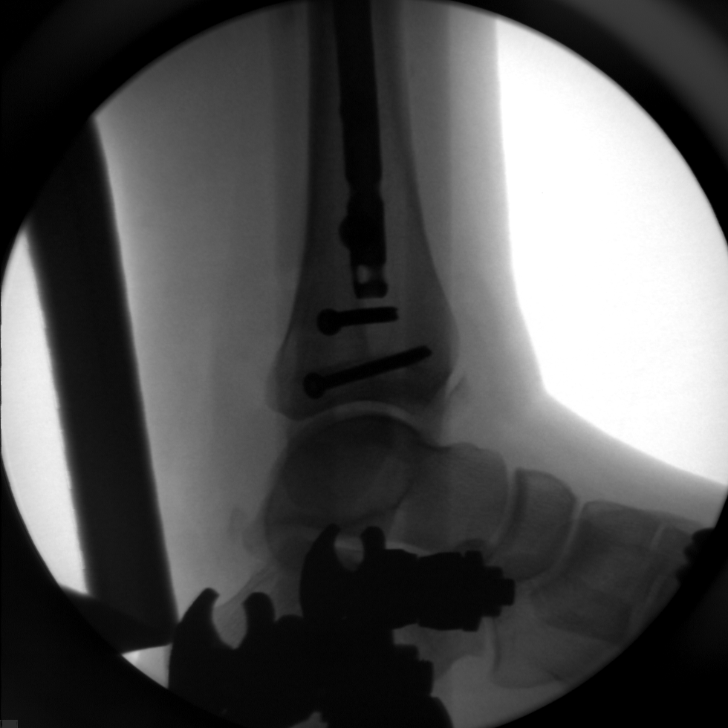

[9 of 9 positions shown; findings below may reference images not displayed]

FINDINGS: Nine spot fluoroscopic images of the right lower leg are
submitted from the operating room.  These demonstrate placement of
a tibial intramedullary nail secured by two proximal and two distal
interlocking screws. There is fixation of the distal tibiofibular
joint with two cortical screws.  Nondisplaced fracture of the
distal fibula appears unchanged.  The more proximal fracture of the
fibular diaphysis demonstrates persistent medial displacement,
similar to the prior CT.  External fixators are noted.
IMPRESSION: Intraoperative views during tibial intramedullary nail and distal
tibiofibular joint ORIF as described.  No demonstrated
complication.

## 2018-10-28 ENCOUNTER — Telehealth: Payer: Self-pay | Admitting: Gastroenterology

## 2018-10-28 ENCOUNTER — Encounter (HOSPITAL_COMMUNITY): Payer: Self-pay

## 2018-10-28 ENCOUNTER — Emergency Department (HOSPITAL_COMMUNITY)
Admission: EM | Admit: 2018-10-28 | Discharge: 2018-10-28 | Disposition: A | Discharge status: Home / Self Care | Payer: Self-pay | Attending: Emergency Medicine | Admitting: Emergency Medicine

## 2018-10-28 ENCOUNTER — Other Ambulatory Visit: Payer: Self-pay

## 2018-10-28 ENCOUNTER — Emergency Department (HOSPITAL_COMMUNITY): Payer: Self-pay

## 2018-10-28 DIAGNOSIS — X58XXXA Exposure to other specified factors, initial encounter: Secondary | ICD-10-CM | POA: Insufficient documentation

## 2018-10-28 DIAGNOSIS — Y999 Unspecified external cause status: Secondary | ICD-10-CM | POA: Insufficient documentation

## 2018-10-28 DIAGNOSIS — F17210 Nicotine dependence, cigarettes, uncomplicated: Secondary | ICD-10-CM | POA: Insufficient documentation

## 2018-10-28 DIAGNOSIS — Y9389 Activity, other specified: Secondary | ICD-10-CM | POA: Insufficient documentation

## 2018-10-28 DIAGNOSIS — T185XXA Foreign body in anus and rectum, initial encounter: Secondary | ICD-10-CM | POA: Insufficient documentation

## 2018-10-28 DIAGNOSIS — Y929 Unspecified place or not applicable: Secondary | ICD-10-CM | POA: Insufficient documentation

## 2018-10-28 LAB — POC URINE PREG, ED: Preg Test, Ur: NEGATIVE
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
## 2018-10-28 NOTE — Telephone Encounter (Signed)
28 year old female presented to ED last night after unintentionally losing a luvkis spade butt plug (silicone). I called to check on her. She notes that she passed the object shortly after returning home from the ED.  No pain but does note soreness similar to s/p bowel movement. No rectal bleeding. Just had a BM. Looked like normal BM to her. No abdominal pain. She is to call if any rectal bleeding, persistent discomfort.   Gelene Mink, PhD, ANP-BC [facility]

## 2018-10-28 NOTE — ED Triage Notes (Signed)
Pt arrives from home c/o butt-plug stuck in rectum. Pt ambulatory. Denies pain.

## 2018-10-28 NOTE — Discharge Instructions (Addendum)
Take clear liquids only by mouth.  You should get a call around 830 from Dr. Aura Fey office to check on you.  If you have not passed the but plugged by then, they will arrange for you to get a scope up your rectum to get the blood plug out

## 2018-11-23 LAB — HM PAP SMEAR: HM Pap smear: NORMAL

## 2019-06-24 ENCOUNTER — Other Ambulatory Visit: Payer: Self-pay

## 2019-06-24 ENCOUNTER — Ambulatory Visit (HOSPITAL_COMMUNITY)
Admission: EM | Admit: 2019-06-24 | Discharge: 2019-06-24 | Disposition: A | Discharge status: Home / Self Care | Payer: BC Managed Care – PPO | Attending: Emergency Medicine | Admitting: Emergency Medicine

## 2019-06-24 ENCOUNTER — Encounter (HOSPITAL_COMMUNITY): Payer: Self-pay | Admitting: Emergency Medicine

## 2019-06-24 DIAGNOSIS — Z793 Long term (current) use of hormonal contraceptives: Secondary | ICD-10-CM | POA: Diagnosis not present

## 2019-06-24 DIAGNOSIS — E669 Obesity, unspecified: Secondary | ICD-10-CM | POA: Insufficient documentation

## 2019-06-24 DIAGNOSIS — Z20828 Contact with and (suspected) exposure to other viral communicable diseases: Secondary | ICD-10-CM | POA: Insufficient documentation

## 2019-06-24 DIAGNOSIS — Z975 Presence of (intrauterine) contraceptive device: Secondary | ICD-10-CM | POA: Insufficient documentation

## 2019-06-24 DIAGNOSIS — J039 Acute tonsillitis, unspecified: Secondary | ICD-10-CM | POA: Diagnosis present

## 2019-06-24 DIAGNOSIS — Z87891 Personal history of nicotine dependence: Secondary | ICD-10-CM | POA: Diagnosis not present

## 2019-06-24 DIAGNOSIS — K219 Gastro-esophageal reflux disease without esophagitis: Secondary | ICD-10-CM | POA: Insufficient documentation

## 2019-06-24 LAB — POCT RAPID STREP A: Streptococcus, Group A Screen (Direct): NEGATIVE

## 2019-06-24 LAB — POCT INFECTIOUS MONO SCREEN: Mono Screen: NEGATIVE

## 2019-06-24 MED ORDER — AMOXICILLIN 500 MG PO CAPS: 500.0000 mg | ORAL_CAPSULE | Freq: Two times a day (BID) | ORAL | 0 refills | Status: AC

## 2019-06-24 MED ORDER — IBUPROFEN 800 MG PO TABS: 800.0000 mg | ORAL_TABLET | Freq: Three times a day (TID) | ORAL | 0 refills | Status: DC

## 2019-06-24 NOTE — Discharge Instructions (Addendum)
Sore Throat  Your rapid strep tested Negative today, Mono negative.  Covid swab pending, quarantine until results return in approximately 2 days.  Monitor results on MyChart.  We will call if positive.  Begin amoxicillin twice daily for the next 10 days to treat for tonsillitis.  Please continue Tylenol or Ibuprofen for fever and pain. May try salt water gargles, cepacol lozenges, throat spray, or OTC cold relief medicine for throat discomfort. If you also have congestion take a daily anti-histamine like Zyrtec, Claritin, and a oral decongestant to help with post nasal drip that may be irritating your throat.   Stay hydrated and drink plenty of fluids to keep your throat coated relieve irritation.

## 2019-06-24 NOTE — ED Triage Notes (Signed)
sore throat with Vantol patches on tonsils for 3 days, no fever

## 2019-06-25 LAB — NOVEL CORONAVIRUS, NAA (HOSP ORDER, SEND-OUT TO REF LAB; TAT 18-24 HRS): SARS-CoV-2, NAA: NOT DETECTED

## 2019-06-26 LAB — CULTURE, GROUP A STREP (THRC)
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
## 2020-04-17 ENCOUNTER — Encounter: Payer: Self-pay | Admitting: Adult Health

## 2020-04-17 ENCOUNTER — Other Ambulatory Visit: Payer: Self-pay

## 2020-04-17 ENCOUNTER — Ambulatory Visit (INDEPENDENT_AMBULATORY_CARE_PROVIDER_SITE_OTHER): Payer: BC Managed Care – PPO | Admitting: Adult Health

## 2020-04-17 VITALS — BP 129/88 | HR 75 | Ht 65.0 in | Wt 240.0 lb

## 2020-04-17 DIAGNOSIS — G4700 Insomnia, unspecified: Secondary | ICD-10-CM

## 2020-04-17 DIAGNOSIS — F411 Generalized anxiety disorder: Secondary | ICD-10-CM

## 2020-04-17 DIAGNOSIS — F319 Bipolar disorder, unspecified: Secondary | ICD-10-CM

## 2020-04-17 DIAGNOSIS — F502 Bulimia nervosa, unspecified: Secondary | ICD-10-CM

## 2020-04-17 DIAGNOSIS — F4310 Post-traumatic stress disorder, unspecified: Secondary | ICD-10-CM

## 2020-04-17 MED ORDER — LATUDA 20 MG PO TABS: 20.0000 mg | ORAL_TABLET | Freq: Every day | ORAL | 2 refills | Status: DC

## 2020-04-17 MED ORDER — LATUDA 20 MG PO TABS: 20.0000 mg | ORAL_TABLET | Freq: Every evening | ORAL | 2 refills | Status: DC
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
## 2020-05-15 ENCOUNTER — Encounter: Payer: Self-pay | Admitting: Adult Health

## 2020-05-15 ENCOUNTER — Other Ambulatory Visit: Payer: Self-pay

## 2020-05-15 ENCOUNTER — Ambulatory Visit (INDEPENDENT_AMBULATORY_CARE_PROVIDER_SITE_OTHER): Payer: BC Managed Care – PPO | Admitting: Adult Health

## 2020-05-15 DIAGNOSIS — F4310 Post-traumatic stress disorder, unspecified: Secondary | ICD-10-CM

## 2020-05-15 DIAGNOSIS — F319 Bipolar disorder, unspecified: Secondary | ICD-10-CM

## 2020-05-15 DIAGNOSIS — G4700 Insomnia, unspecified: Secondary | ICD-10-CM | POA: Diagnosis not present

## 2020-05-15 DIAGNOSIS — F411 Generalized anxiety disorder: Secondary | ICD-10-CM
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
## 2020-06-07 DIAGNOSIS — F331 Major depressive disorder, recurrent, moderate: Secondary | ICD-10-CM | POA: Diagnosis not present

## 2020-06-12 ENCOUNTER — Encounter: Payer: Self-pay | Admitting: Adult Health

## 2020-06-12 ENCOUNTER — Other Ambulatory Visit: Payer: Self-pay

## 2020-06-12 ENCOUNTER — Ambulatory Visit (INDEPENDENT_AMBULATORY_CARE_PROVIDER_SITE_OTHER): Payer: BC Managed Care – PPO | Admitting: Adult Health

## 2020-06-12 DIAGNOSIS — F502 Bulimia nervosa, unspecified: Secondary | ICD-10-CM

## 2020-06-12 DIAGNOSIS — F319 Bipolar disorder, unspecified: Secondary | ICD-10-CM

## 2020-06-12 DIAGNOSIS — F4310 Post-traumatic stress disorder, unspecified: Secondary | ICD-10-CM

## 2020-06-12 DIAGNOSIS — G4700 Insomnia, unspecified: Secondary | ICD-10-CM | POA: Diagnosis not present

## 2020-06-12 DIAGNOSIS — F411 Generalized anxiety disorder: Secondary | ICD-10-CM
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
## 2020-06-28 ENCOUNTER — Other Ambulatory Visit: Payer: Self-pay | Admitting: Adult Health

## 2020-06-28 ENCOUNTER — Telehealth: Payer: Self-pay | Admitting: Adult Health

## 2020-06-28 DIAGNOSIS — F319 Bipolar disorder, unspecified: Secondary | ICD-10-CM

## 2020-06-28 MED ORDER — CARIPRAZINE HCL 1.5 MG PO CAPS: 1.5000 mg | ORAL_CAPSULE | Freq: Every day | ORAL | 2 refills | Status: DC

## 2020-06-28 NOTE — Telephone Encounter (Signed)
Noted  

## 2020-06-28 NOTE — Telephone Encounter (Signed)
[name] was given samples for Vraylar 1.5 mg when she was seen on 06/13/20 and it is working well for her. She would like a prescription called in at [facility], [address] [place], [place]. [phone].

## 2020-06-28 NOTE — Telephone Encounter (Signed)
Script sent  

## 2020-06-28 NOTE — Telephone Encounter (Signed)
Rene Kocher, please send the RX to [facility] [phone] and phone number is [phone]. This one is in her chart.

## 2020-07-03 ENCOUNTER — Telehealth: Payer: Self-pay

## 2020-07-03 DIAGNOSIS — F331 Major depressive disorder, recurrent, moderate: Secondary | ICD-10-CM | POA: Diagnosis not present

## 2020-07-03 NOTE — Telephone Encounter (Signed)
Prior Authorization submitted and approved for VRAYLAR 1.5 MG CAPSULES, effective 07/03/2020-07/02/2021 with BCBS ID# [id number]

## 2020-07-06 DIAGNOSIS — Z20822 Contact with and (suspected) exposure to covid-19: Secondary | ICD-10-CM | POA: Diagnosis not present

## 2020-07-06 DIAGNOSIS — Z1159 Encounter for screening for other viral diseases: Secondary | ICD-10-CM | POA: Diagnosis not present

## 2020-07-11 ENCOUNTER — Ambulatory Visit: Payer: BC Managed Care – PPO | Admitting: Adult Health

## 2020-07-17 ENCOUNTER — Ambulatory Visit: Payer: BC Managed Care – PPO | Admitting: Adult Health

## 2020-08-10 DIAGNOSIS — M79662 Pain in left lower leg: Secondary | ICD-10-CM | POA: Diagnosis not present

## 2020-08-18 DIAGNOSIS — Z113 Encounter for screening for infections with a predominantly sexual mode of transmission: Secondary | ICD-10-CM | POA: Diagnosis not present

## 2020-09-19 ENCOUNTER — Other Ambulatory Visit: Payer: Self-pay

## 2020-09-19 ENCOUNTER — Ambulatory Visit (INDEPENDENT_AMBULATORY_CARE_PROVIDER_SITE_OTHER): Payer: BC Managed Care – PPO | Admitting: Adult Health

## 2020-09-19 ENCOUNTER — Encounter: Payer: Self-pay | Admitting: Adult Health

## 2020-09-19 DIAGNOSIS — F319 Bipolar disorder, unspecified: Secondary | ICD-10-CM

## 2020-09-19 DIAGNOSIS — F411 Generalized anxiety disorder: Secondary | ICD-10-CM | POA: Diagnosis not present

## 2020-09-19 DIAGNOSIS — G4700 Insomnia, unspecified: Secondary | ICD-10-CM | POA: Diagnosis not present

## 2020-09-19 DIAGNOSIS — F4310 Post-traumatic stress disorder, unspecified: Secondary | ICD-10-CM

## 2020-09-19 MED ORDER — LAMOTRIGINE 25 MG PO TABS: ORAL_TABLET | ORAL | 2 refills | Status: DC

## 2020-09-19 MED ORDER — TRAZODONE HCL 50 MG PO TABS: 50.0000 mg | ORAL_TABLET | Freq: Every day | ORAL | 2 refills | Status: DC
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
## 2020-10-19 ENCOUNTER — Ambulatory Visit: Payer: BC Managed Care – PPO | Admitting: Adult Health

## 2020-11-06 ENCOUNTER — Telehealth: Payer: Self-pay | Admitting: Adult Health

## 2020-11-06 NOTE — Telephone Encounter (Signed)
Please review

## 2020-11-06 NOTE — Telephone Encounter (Signed)
Pt informed

## 2020-11-06 NOTE — Telephone Encounter (Signed)
Pt wants to know since she missed 4 days of taking lamotrigine should she start back on the lower dose of 25mg  then go back up to 50mg . Please call.

## 2020-11-07 ENCOUNTER — Encounter: Payer: Self-pay | Admitting: Adult Health

## 2020-11-07 ENCOUNTER — Ambulatory Visit (INDEPENDENT_AMBULATORY_CARE_PROVIDER_SITE_OTHER): Payer: BC Managed Care – PPO | Admitting: Adult Health

## 2020-11-07 ENCOUNTER — Other Ambulatory Visit: Payer: Self-pay

## 2020-11-07 DIAGNOSIS — F4310 Post-traumatic stress disorder, unspecified: Secondary | ICD-10-CM

## 2020-11-07 DIAGNOSIS — F411 Generalized anxiety disorder: Secondary | ICD-10-CM

## 2020-11-07 DIAGNOSIS — F319 Bipolar disorder, unspecified: Secondary | ICD-10-CM | POA: Diagnosis not present

## 2020-11-07 DIAGNOSIS — G4700 Insomnia, unspecified: Secondary | ICD-10-CM

## 2020-11-07 MED ORDER — LAMOTRIGINE 25 MG PO TABS: ORAL_TABLET | ORAL | 2 refills | Status: DC
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
## 2020-12-12 ENCOUNTER — Encounter: Payer: Self-pay | Admitting: Adult Health

## 2020-12-12 ENCOUNTER — Ambulatory Visit: Payer: BC Managed Care – PPO | Admitting: Adult Health

## 2020-12-12 ENCOUNTER — Other Ambulatory Visit: Payer: Self-pay

## 2020-12-12 DIAGNOSIS — F4310 Post-traumatic stress disorder, unspecified: Secondary | ICD-10-CM

## 2020-12-12 DIAGNOSIS — F411 Generalized anxiety disorder: Secondary | ICD-10-CM | POA: Diagnosis not present

## 2020-12-12 DIAGNOSIS — F319 Bipolar disorder, unspecified: Secondary | ICD-10-CM | POA: Diagnosis not present

## 2020-12-12 DIAGNOSIS — G4700 Insomnia, unspecified: Secondary | ICD-10-CM

## 2020-12-12 DIAGNOSIS — F502 Bulimia nervosa: Secondary | ICD-10-CM

## 2020-12-12 MED ORDER — LAMOTRIGINE 100 MG PO TABS: 100.0000 mg | ORAL_TABLET | Freq: Every day | ORAL | 5 refills | Status: DC
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
## 2021-01-15 DIAGNOSIS — N760 Acute vaginitis: Secondary | ICD-10-CM | POA: Diagnosis not present

## 2021-01-15 DIAGNOSIS — Z113 Encounter for screening for infections with a predominantly sexual mode of transmission: Secondary | ICD-10-CM | POA: Diagnosis not present

## 2021-01-16 ENCOUNTER — Ambulatory Visit: Payer: BC Managed Care – PPO | Admitting: Adult Health

## 2021-03-27 ENCOUNTER — Encounter: Payer: Self-pay | Admitting: Adult Health

## 2021-03-27 ENCOUNTER — Other Ambulatory Visit: Payer: Self-pay

## 2021-03-27 ENCOUNTER — Ambulatory Visit: Payer: BC Managed Care – PPO | Admitting: Adult Health

## 2021-03-27 DIAGNOSIS — F319 Bipolar disorder, unspecified: Secondary | ICD-10-CM

## 2021-03-27 DIAGNOSIS — F411 Generalized anxiety disorder: Secondary | ICD-10-CM

## 2021-03-27 MED ORDER — LAMOTRIGINE 150 MG PO TABS: 150.0000 mg | ORAL_TABLET | Freq: Every day | ORAL | 5 refills | Status: DC

## 2021-03-27 MED ORDER — PROPRANOLOL HCL 10 MG PO TABS: 10.0000 mg | ORAL_TABLET | Freq: Two times a day (BID) | ORAL | 5 refills | Status: DC
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
## 2021-05-22 ENCOUNTER — Ambulatory Visit: Payer: Self-pay | Admitting: Adult Health

## 2021-10-24 ENCOUNTER — Encounter: Payer: Self-pay | Admitting: Adult Health

## 2021-10-24 ENCOUNTER — Ambulatory Visit (INDEPENDENT_AMBULATORY_CARE_PROVIDER_SITE_OTHER): Payer: Self-pay | Admitting: Adult Health

## 2021-10-24 DIAGNOSIS — F319 Bipolar disorder, unspecified: Secondary | ICD-10-CM

## 2021-10-24 MED ORDER — LAMOTRIGINE 200 MG PO TABS: 200.0000 mg | ORAL_TABLET | Freq: Every day | ORAL | 5 refills | Status: DC

## 2021-10-24 MED ORDER — BUPROPION HCL ER (XL) 150 MG PO TB24: ORAL_TABLET | ORAL | 2 refills | Status: DC
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
## 2022-04-01 ENCOUNTER — Ambulatory Visit (INDEPENDENT_AMBULATORY_CARE_PROVIDER_SITE_OTHER): Payer: Self-pay | Admitting: Adult Health

## 2022-04-01 ENCOUNTER — Encounter: Payer: Self-pay | Admitting: Adult Health

## 2022-04-01 DIAGNOSIS — F489 Nonpsychotic mental disorder, unspecified: Secondary | ICD-10-CM

## 2022-04-01 NOTE — Progress Notes (Signed)
Patient no show appointment. ? ?
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
## 2022-04-29 ENCOUNTER — Encounter: Payer: Self-pay | Admitting: Nurse Practitioner

## 2022-04-29 ENCOUNTER — Ambulatory Visit: Payer: BC Managed Care – PPO | Admitting: Nurse Practitioner

## 2022-04-29 VITALS — BP 133/86 | HR 90 | Temp 97.3°F | Ht 64.0 in | Wt 291.6 lb

## 2022-04-29 DIAGNOSIS — Z13220 Encounter for screening for lipoid disorders: Secondary | ICD-10-CM

## 2022-04-29 DIAGNOSIS — M19171 Post-traumatic osteoarthritis, right ankle and foot: Secondary | ICD-10-CM

## 2022-04-29 DIAGNOSIS — Z23 Encounter for immunization: Secondary | ICD-10-CM

## 2022-04-29 DIAGNOSIS — F319 Bipolar disorder, unspecified: Secondary | ICD-10-CM | POA: Diagnosis not present

## 2022-04-29 DIAGNOSIS — Z9109 Other allergy status, other than to drugs and biological substances: Secondary | ICD-10-CM | POA: Diagnosis not present

## 2022-04-29 DIAGNOSIS — R7301 Impaired fasting glucose: Secondary | ICD-10-CM | POA: Diagnosis not present

## 2022-04-29 DIAGNOSIS — G43009 Migraine without aura, not intractable, without status migrainosus: Secondary | ICD-10-CM | POA: Diagnosis not present

## 2022-04-29 LAB — CBC
HCT: 41.5 % (ref 36.0–46.0)
Hemoglobin: 13.7 g/dL (ref 12.0–15.0)
MCHC: 33 g/dL (ref 30.0–36.0)
MCV: 90.6 fl (ref 78.0–100.0)
Platelets: 201 10*3/uL (ref 150.0–400.0)
RBC: 4.58 Mil/uL (ref 3.87–5.11)
RDW: 13.1 % (ref 11.5–15.5)
WBC: 6.6 10*3/uL (ref 4.0–10.5)

## 2022-04-29 LAB — LIPID PANEL
Cholesterol: 191 mg/dL (ref 0–200)
HDL: 35.9 mg/dL — ABNORMAL LOW (ref >39.00)
NonHDL: 155.43
Total CHOL/HDL Ratio: 5
Triglycerides: 213 mg/dL — ABNORMAL HIGH (ref 0.0–149.0)
VLDL: 42.6 mg/dL — ABNORMAL HIGH (ref 0.0–40.0)

## 2022-04-29 LAB — COMPREHENSIVE METABOLIC PANEL
ALT: 33 U/L (ref 0–35)
AST: 23 U/L (ref 0–37)
Albumin: 4.6 g/dL (ref 3.5–5.2)
Alkaline Phosphatase: 69 U/L (ref 39–117)
BUN: 12 mg/dL (ref 6–23)
CO2: 26 mEq/L (ref 19–32)
Calcium: 9.4 mg/dL (ref 8.4–10.5)
Chloride: 102 mEq/L (ref 96–112)
Creatinine, Ser: 0.95 mg/dL (ref 0.40–1.20)
GFR: 80.7 mL/min (ref >60.00)
Glucose, Bld: 88 mg/dL (ref 70–99)
Potassium: 4 mEq/L (ref 3.5–5.1)
Sodium: 136 mEq/L (ref 135–145)
Total Bilirubin: 0.5 mg/dL (ref 0.2–1.2)
Total Protein: 7.3 g/dL (ref 6.0–8.3)

## 2022-04-29 LAB — HEMOGLOBIN A1C: Hgb A1c MFr Bld: 5.5 % (ref 4.6–6.5)

## 2022-04-29 LAB — LDL CHOLESTEROL, DIRECT: Direct LDL: 133 mg/dL

## 2022-04-29 LAB — HM HIV SCREENING LAB: HM HIV Screening: NEGATIVE

## 2022-04-29 LAB — HM HEPATITIS C SCREENING LAB: HM Hepatitis Screen: NEGATIVE

## 2022-04-29 MED ORDER — MONTELUKAST SODIUM 10 MG PO TABS: 10.0000 mg | ORAL_TABLET | Freq: Every day | ORAL | 2 refills | Status: DC

## 2022-04-29 NOTE — Assessment & Plan Note (Signed)
BMI 50. She has a recent history of bulimia and states that she has mostly stopped. She is currently following with psychiatry and a therapist for bipolar. She is interested in talking with a nutritionist. Referral placed.

## 2022-04-29 NOTE — Patient Instructions (Signed)
It was great to see you!  Start montelukast 1 tablet at bedtime for your allergies. I have also placed a referral to an allergist.  I have placed a referral to a nutritionist for you as well.   We are checking your labs today and will let you know the results via mychart/phone.   Let's follow-up in 2-3 months, sooner if you have concerns.  If a referral was placed today, you will be contacted for an appointment. Please note that routine referrals can sometimes take up to 3-4 weeks to process. Please call our office if you haven't heard anything after this time frame.  Take care,  Vance Peper, NP

## 2022-04-29 NOTE — Assessment & Plan Note (Signed)
Chronic, stable. She is currently following with a therapist and psychiatrist. Continue wellbutrin 300mg  daily and lamictal 200mg  daily. Continue collaboration and recommendations from psychiatry.
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
## 2022-04-29 NOTE — Addendum Note (Signed)
Addended by: Vance Peper A on: 04/29/2022 10:43 AM   Modules accepted: Level of Service

## 2022-04-29 NOTE — Assessment & Plan Note (Signed)
Chronic, not controlled. She states her allergies have worsened since April 2022. She is currently taking zyrtec 2 tablets daily, using flonase, and benadryl 4 times a day. Will start her on montelukast 10mg  daily and refer her to an allergist.

## 2022-04-29 NOTE — Assessment & Plan Note (Signed)
Chronic, stable. She states the headaches have improved with taking lamictal for bipolar. She can continue tylenol and ibuprofen as needed for acute headache. Follow-up if symptoms worsen or start to occur more frequently.

## 2022-05-13 ENCOUNTER — Encounter: Payer: Self-pay | Admitting: Nurse Practitioner
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
## 2022-06-10 ENCOUNTER — Encounter: Payer: BC Managed Care – PPO | Admitting: Nurse Practitioner

## 2022-06-10 ENCOUNTER — Telehealth: Payer: Self-pay | Admitting: Nurse Practitioner

## 2022-06-10 DIAGNOSIS — Z0000 Encounter for general adult medical examination without abnormal findings: Secondary | ICD-10-CM

## 2022-06-10 DIAGNOSIS — E782 Mixed hyperlipidemia: Secondary | ICD-10-CM

## 2022-06-10 NOTE — Telephone Encounter (Signed)
Pt was a no show for a cpe with Lauren on 06/10/22, I sent a letter.

## 2022-06-10 NOTE — Telephone Encounter (Signed)
Noted  

## 2022-06-10 NOTE — Telephone Encounter (Signed)
1st no show, fee waived, letter sent 

## 2022-06-24 DIAGNOSIS — Z419 Encounter for procedure for purposes other than remedying health state, unspecified: Secondary | ICD-10-CM | POA: Diagnosis not present

## 2022-07-25 DIAGNOSIS — Z419 Encounter for procedure for purposes other than remedying health state, unspecified: Secondary | ICD-10-CM | POA: Diagnosis not present

## 2022-07-30 ENCOUNTER — Encounter: Payer: Self-pay | Admitting: Adult Health

## 2022-07-30 ENCOUNTER — Ambulatory Visit: Payer: BC Managed Care – PPO | Admitting: Adult Health

## 2022-07-30 DIAGNOSIS — F411 Generalized anxiety disorder: Secondary | ICD-10-CM

## 2022-07-30 DIAGNOSIS — G4700 Insomnia, unspecified: Secondary | ICD-10-CM | POA: Diagnosis not present

## 2022-07-30 DIAGNOSIS — F319 Bipolar disorder, unspecified: Secondary | ICD-10-CM

## 2022-07-30 DIAGNOSIS — F4310 Post-traumatic stress disorder, unspecified: Secondary | ICD-10-CM

## 2022-07-30 MED ORDER — BUPROPION HCL ER (XL) 150 MG PO TB24: ORAL_TABLET | ORAL | 2 refills | Status: DC

## 2022-07-30 MED ORDER — LAMOTRIGINE 25 MG PO TABS: ORAL_TABLET | ORAL | 2 refills | Status: DC
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
## 2022-08-22 DIAGNOSIS — Z124 Encounter for screening for malignant neoplasm of cervix: Secondary | ICD-10-CM | POA: Diagnosis not present

## 2022-08-22 DIAGNOSIS — Z6841 Body Mass Index (BMI) 40.0 and over, adult: Secondary | ICD-10-CM | POA: Diagnosis not present

## 2022-08-22 DIAGNOSIS — Z01419 Encounter for gynecological examination (general) (routine) without abnormal findings: Secondary | ICD-10-CM | POA: Diagnosis not present

## 2022-08-23 DIAGNOSIS — Z419 Encounter for procedure for purposes other than remedying health state, unspecified: Secondary | ICD-10-CM | POA: Diagnosis not present

## 2022-08-28 ENCOUNTER — Ambulatory Visit (INDEPENDENT_AMBULATORY_CARE_PROVIDER_SITE_OTHER): Payer: BC Managed Care – PPO | Admitting: Adult Health

## 2022-08-29 ENCOUNTER — Other Ambulatory Visit: Payer: Self-pay | Admitting: Nurse Practitioner

## 2022-09-06 DIAGNOSIS — R102 Pelvic and perineal pain: Secondary | ICD-10-CM | POA: Diagnosis not present

## 2022-09-06 DIAGNOSIS — Z30432 Encounter for removal of intrauterine contraceptive device: Secondary | ICD-10-CM | POA: Diagnosis not present

## 2022-09-23 DIAGNOSIS — Z419 Encounter for procedure for purposes other than remedying health state, unspecified: Secondary | ICD-10-CM | POA: Diagnosis not present

## 2022-10-14 ENCOUNTER — Ambulatory Visit (INDEPENDENT_AMBULATORY_CARE_PROVIDER_SITE_OTHER): Payer: BC Managed Care – PPO | Admitting: Nurse Practitioner

## 2022-10-14 VITALS — BP 124/82 | HR 80 | Temp 97.5°F | Ht 64.0 in | Wt 295.0 lb

## 2022-10-14 DIAGNOSIS — K529 Noninfective gastroenteritis and colitis, unspecified: Secondary | ICD-10-CM

## 2022-10-14 DIAGNOSIS — M545 Low back pain, unspecified: Secondary | ICD-10-CM

## 2022-10-14 MED ORDER — ONDANSETRON HCL 4 MG PO TABS: 4.0000 mg | ORAL_TABLET | Freq: Three times a day (TID) | ORAL | 0 refills | Status: AC | PRN

## 2022-10-14 MED ORDER — CYCLOBENZAPRINE HCL 10 MG PO TABS: 10.0000 mg | ORAL_TABLET | Freq: Three times a day (TID) | ORAL | 0 refills | Status: AC | PRN

## 2022-10-14 MED ORDER — MONTELUKAST SODIUM 10 MG PO TABS: 10.0000 mg | ORAL_TABLET | Freq: Every day | ORAL | 1 refills | Status: DC

## 2022-10-14 NOTE — Progress Notes (Signed)
Acute Office Visit  Subjective:     Patient ID: [name], female    DOB: 1991/06/13, 32 y.o.   MRN: [id number]  Chief Complaint  Patient presents with   Back Pain    Lower back pain for 2 weeks, nausea and vomiting and diarrhea, Rx refill    HPI Patient is in today for low back pain for 2 weeks. She also started with nausea, vomiting, and diarrhea since last night.   She states that she bent over 2 weeks ago and flared up her back pain. She also has noticed some pain in her both hips. She has been stretching which has helped. She has been taking aleve or naproxen which helps. She has also used some heat and aspercreme.  She states that bending over and turning over in bed makes the pain worse.  The pain will sometimes radiate down her leg.  She also notes that she started having nausea, vomiting, and diarrhea last night.  She has vomited several times throughout the night.  She is having stomach cramping and pain.  She has not been able to keep down food or liquids.  She has not been around any sick contacts, however she does work around many people.  She denies fevers, however does feel achy.  ROS See pertinent positives and negatives per HPI.     Objective:    BP 124/82 (BP Location: Left Arm)   Pulse 80   Temp (!) 97.5 F (36.4 C)   Ht  (1.626 m)   Wt 295 lb (133.8 kg)   LMP 09/22/2022 (Approximate)   SpO2 97%   BMI 50.64 kg/m    Physical Exam Vitals and nursing note reviewed.  Constitutional:      General: She is not in acute distress.    Appearance: Normal appearance.  HENT:     Head: Normocephalic.  Eyes:     Conjunctiva/sclera: Conjunctivae normal.  Cardiovascular:     Rate and Rhythm: Normal rate and regular rhythm.     Pulses: Normal pulses.     Heart sounds: Normal heart sounds.  Pulmonary:     Effort: Pulmonary effort is normal.     Breath sounds: Normal breath sounds.  Abdominal:     General: There is no distension.     Palpations:  Abdomen is soft.     Tenderness: There is no abdominal tenderness. There is no guarding or rebound.     Hernia: No hernia is present.  Musculoskeletal:        General: Tenderness present. No swelling.     Cervical back: Normal range of motion.  Skin:    General: Skin is warm.  Neurological:     General: No focal deficit present.     Mental Status: She is alert and oriented to person, place, and time.  Psychiatric:        Mood and Affect: Mood normal.        Behavior: Behavior normal.        Thought Content: Thought content normal.        Judgment: Judgment normal.       Assessment & Plan:   Problem List Items Addressed This Visit   None Visit Diagnoses     Gastroenteritis    -  Primary   Most likely viral. Start zofran q8hr prn nausea. Discussed starting sips of fluids until tolerating, then slowly increase. ER precuations discussed.   Acute midline low back pain without sciatica  No red flags on exam. Stretches given. Continue aleve/naproxen prn. Start flexeril TID prn spasm, may cause fatigue. F/U if not improving.   Relevant Medications   cyclobenzaprine (FLEXERIL) 10 MG tablet       Meds ordered this encounter  Medications   ondansetron (ZOFRAN) 4 MG tablet    Sig: Take 1 tablet (4 mg total) by mouth every 8 (eight) hours as needed for nausea or vomiting.    Dispense:  30 tablet    Refill:  0   cyclobenzaprine (FLEXERIL) 10 MG tablet    Sig: Take 1 tablet (10 mg total) by mouth 3 (three) times daily as needed for muscle spasms.    Dispense:  30 tablet    Refill:  0   montelukast (SINGULAIR) 10 MG tablet    Sig: Take 1 tablet (10 mg total) by mouth at bedtime.    Dispense:  90 tablet    Refill:  1    **Patient requests 90 days supply**    Return if symptoms worsen or fail to improve.  Gerre Scull, NP

## 2022-10-14 NOTE — Patient Instructions (Signed)
It was great to see you!  Keep doing back stretches daily. Start flexeril 3 times a day as needed for spasms/pain. This may make you sleepy.   I have sent in some zofran to take 3 times a day as needed for nausea.   Just do sips at first of fluids or ice chips until you can keep them down, then do a few more sips. Then go to bland foods.   Let's follow-up if your symptoms worsen or don't improve.   Take care,  Rodman Pickle, NP

## 2022-10-23 DIAGNOSIS — Z419 Encounter for procedure for purposes other than remedying health state, unspecified: Secondary | ICD-10-CM | POA: Diagnosis not present

## 2022-11-23 DIAGNOSIS — Z419 Encounter for procedure for purposes other than remedying health state, unspecified: Secondary | ICD-10-CM | POA: Diagnosis not present

## 2022-11-24 ENCOUNTER — Other Ambulatory Visit: Payer: Self-pay | Admitting: Adult Health

## 2022-11-24 DIAGNOSIS — F319 Bipolar disorder, unspecified: Secondary | ICD-10-CM

## 2022-11-25 NOTE — Telephone Encounter (Signed)
Sent MyChart msg

## 2022-11-26 ENCOUNTER — Other Ambulatory Visit: Payer: Self-pay | Admitting: Adult Health

## 2022-11-26 DIAGNOSIS — F319 Bipolar disorder, unspecified: Secondary | ICD-10-CM

## 2022-12-11 ENCOUNTER — Ambulatory Visit (INDEPENDENT_AMBULATORY_CARE_PROVIDER_SITE_OTHER): Payer: BC Managed Care – PPO | Admitting: Adult Health

## 2022-12-11 ENCOUNTER — Encounter: Payer: Self-pay | Admitting: Adult Health

## 2022-12-11 DIAGNOSIS — F4310 Post-traumatic stress disorder, unspecified: Secondary | ICD-10-CM

## 2022-12-11 DIAGNOSIS — F411 Generalized anxiety disorder: Secondary | ICD-10-CM | POA: Diagnosis not present

## 2022-12-11 DIAGNOSIS — F319 Bipolar disorder, unspecified: Secondary | ICD-10-CM

## 2022-12-11 DIAGNOSIS — G4700 Insomnia, unspecified: Secondary | ICD-10-CM

## 2022-12-11 MED ORDER — LAMOTRIGINE 25 MG PO TABS
ORAL_TABLET | ORAL | 5 refills | Status: DC
Start: 2022-12-11 — End: 2022-12-11

## 2022-12-11 MED ORDER — HYDROXYZINE HCL 25 MG PO TABS
ORAL_TABLET | ORAL | 5 refills | Status: DC
Start: 2022-12-11 — End: 2023-07-04

## 2022-12-11 MED ORDER — BUPROPION HCL ER (XL) 150 MG PO TB24
ORAL_TABLET | ORAL | 5 refills | Status: DC
Start: 2022-12-11 — End: 2023-07-04

## 2022-12-11 MED ORDER — LAMOTRIGINE 100 MG PO TABS
ORAL_TABLET | ORAL | 5 refills | Status: DC
Start: 2022-12-11 — End: 2023-07-04
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
## 2022-12-23 DIAGNOSIS — Z419 Encounter for procedure for purposes other than remedying health state, unspecified: Secondary | ICD-10-CM | POA: Diagnosis not present

## 2023-03-25 DIAGNOSIS — Z419 Encounter for procedure for purposes other than remedying health state, unspecified: Secondary | ICD-10-CM | POA: Diagnosis not present

## 2023-04-25 DIAGNOSIS — Z419 Encounter for procedure for purposes other than remedying health state, unspecified: Secondary | ICD-10-CM | POA: Diagnosis not present

## 2023-05-25 DIAGNOSIS — Z419 Encounter for procedure for purposes other than remedying health state, unspecified: Secondary | ICD-10-CM | POA: Diagnosis not present

## 2023-06-12 ENCOUNTER — Other Ambulatory Visit: Payer: Self-pay

## 2023-06-12 MED ORDER — MONTELUKAST SODIUM 10 MG PO TABS: 10.0000 mg | ORAL_TABLET | Freq: Every day | ORAL | 1 refills | Status: DC

## 2023-06-12 NOTE — Telephone Encounter (Signed)
  Copied from CRM [id number]. Topic: Clinical - Medication Refill >> Jun 12, 2023  3:30 PM Almira Coaster wrote: Most Recent Primary Care Visit:  Provider: Rodman Pickle A  Department: [facility]  Visit Type: OFFICE VISIT  Date: 10/14/2022  Medication: montelukast (SINGULAIR) 10 MG tablet   Has the patient contacted their pharmacy? Yes, They do not have any refills left. (Agent: If no, request that the patient contact the pharmacy for the refill. If patient does not wish to contact the pharmacy document the reason why and proceed with request.) (Agent: If yes, when and what did the pharmacy advise?)  Is this the correct pharmacy for this prescription? Yes If no, delete pharmacy and type the correct one.  This is the patient's preferred pharmacy:  [id number] - [place], [place] - [address] AT [facility] OF [address] [place] [address] Phone: [phone] Fax: [phone]   Has the prescription been filled recently? No  Is the patient out of the medication? No, but she will be out before her physical on Jan 10,2025. She only has enough for 6 days.  Has the patient been seen for an appointment in the last year OR does the patient have an upcoming appointment? Yes  Can we respond through MyChart? No, patient prefers a phone call.  Agent: Please be advised that Rx refills may take up to 3 business days. We ask that you follow-up with your pharmacy.   Rx went to the pharmacy and message left to advise that it has been sent. Dm/cma

## 2023-06-25 DIAGNOSIS — Z419 Encounter for procedure for purposes other than remedying health state, unspecified: Secondary | ICD-10-CM | POA: Diagnosis not present

## 2023-07-04 ENCOUNTER — Encounter: Payer: BC Managed Care – PPO | Admitting: Nurse Practitioner

## 2023-07-04 ENCOUNTER — Encounter: Payer: Self-pay | Admitting: Adult Health

## 2023-07-04 ENCOUNTER — Telehealth: Payer: BC Managed Care – PPO | Admitting: Adult Health

## 2023-07-04 DIAGNOSIS — F319 Bipolar disorder, unspecified: Secondary | ICD-10-CM | POA: Diagnosis not present

## 2023-07-04 DIAGNOSIS — F411 Generalized anxiety disorder: Secondary | ICD-10-CM | POA: Diagnosis not present

## 2023-07-04 DIAGNOSIS — G4700 Insomnia, unspecified: Secondary | ICD-10-CM | POA: Diagnosis not present

## 2023-07-04 DIAGNOSIS — G43009 Migraine without aura, not intractable, without status migrainosus: Secondary | ICD-10-CM

## 2023-07-04 MED ORDER — LAMOTRIGINE 100 MG PO TABS
ORAL_TABLET | ORAL | 5 refills | Status: DC
Start: 2023-07-04 — End: 2024-01-15

## 2023-07-04 MED ORDER — BUPROPION HCL ER (XL) 300 MG PO TB24
ORAL_TABLET | ORAL | 5 refills | Status: DC
Start: 2023-07-04 — End: 2024-01-15

## 2023-07-04 MED ORDER — HYDROXYZINE HCL 25 MG PO TABS
ORAL_TABLET | ORAL | 5 refills | Status: DC
Start: 2023-07-04 — End: 2024-01-15
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
## 2023-07-07 ENCOUNTER — Other Ambulatory Visit: Payer: Self-pay | Admitting: Adult Health

## 2023-07-07 DIAGNOSIS — F319 Bipolar disorder, unspecified: Secondary | ICD-10-CM

## 2023-07-26 DIAGNOSIS — Z419 Encounter for procedure for purposes other than remedying health state, unspecified: Secondary | ICD-10-CM | POA: Diagnosis not present

## 2023-08-06 DIAGNOSIS — M25572 Pain in left ankle and joints of left foot: Secondary | ICD-10-CM | POA: Diagnosis not present

## 2023-08-06 DIAGNOSIS — M19172 Post-traumatic osteoarthritis, left ankle and foot: Secondary | ICD-10-CM | POA: Diagnosis not present

## 2023-08-11 ENCOUNTER — Other Ambulatory Visit: Payer: Self-pay | Admitting: Orthopedic Surgery

## 2023-08-11 DIAGNOSIS — S99919A Unspecified injury of unspecified ankle, initial encounter: Secondary | ICD-10-CM

## 2023-08-11 DIAGNOSIS — M25572 Pain in left ankle and joints of left foot: Secondary | ICD-10-CM

## 2023-08-23 DIAGNOSIS — Z419 Encounter for procedure for purposes other than remedying health state, unspecified: Secondary | ICD-10-CM | POA: Diagnosis not present

## 2023-09-03 DIAGNOSIS — Z419 Encounter for procedure for purposes other than remedying health state, unspecified: Secondary | ICD-10-CM | POA: Diagnosis not present

## 2023-09-08 ENCOUNTER — Encounter: Payer: BC Managed Care – PPO | Admitting: Nurse Practitioner

## 2023-10-04 DIAGNOSIS — Z419 Encounter for procedure for purposes other than remedying health state, unspecified: Secondary | ICD-10-CM | POA: Diagnosis not present

## 2023-10-07 ENCOUNTER — Encounter: Admitting: Nurse Practitioner

## 2023-11-03 DIAGNOSIS — Z419 Encounter for procedure for purposes other than remedying health state, unspecified: Secondary | ICD-10-CM | POA: Diagnosis not present

## 2023-11-04 ENCOUNTER — Telehealth: Payer: Self-pay | Admitting: Nurse Practitioner

## 2023-11-04 ENCOUNTER — Encounter: Payer: Self-pay | Admitting: Nurse Practitioner

## 2023-11-04 NOTE — Telephone Encounter (Signed)
 10/07/2023 1st no show since 2023, letter sent via mychart

## 2023-11-27 ENCOUNTER — Encounter: Payer: Self-pay | Admitting: Nurse Practitioner

## 2023-11-27 ENCOUNTER — Ambulatory Visit (INDEPENDENT_AMBULATORY_CARE_PROVIDER_SITE_OTHER): Admitting: Nurse Practitioner

## 2023-11-27 VITALS — BP 116/70 | HR 95 | Temp 97.8°F | Ht 64.0 in | Wt 287.8 lb

## 2023-11-27 DIAGNOSIS — F319 Bipolar disorder, unspecified: Secondary | ICD-10-CM | POA: Diagnosis not present

## 2023-11-27 DIAGNOSIS — Z0000 Encounter for general adult medical examination without abnormal findings: Secondary | ICD-10-CM | POA: Diagnosis not present

## 2023-11-27 DIAGNOSIS — Z9109 Other allergy status, other than to drugs and biological substances: Secondary | ICD-10-CM

## 2023-11-27 DIAGNOSIS — L819 Disorder of pigmentation, unspecified: Secondary | ICD-10-CM

## 2023-11-27 DIAGNOSIS — E785 Hyperlipidemia, unspecified: Secondary | ICD-10-CM | POA: Diagnosis not present

## 2023-11-27 DIAGNOSIS — Z789 Other specified health status: Secondary | ICD-10-CM | POA: Diagnosis not present

## 2023-11-27 DIAGNOSIS — G43009 Migraine without aura, not intractable, without status migrainosus: Secondary | ICD-10-CM | POA: Diagnosis not present

## 2023-11-27 DIAGNOSIS — M19171 Post-traumatic osteoarthritis, right ankle and foot: Secondary | ICD-10-CM

## 2023-11-27 MED ORDER — MONTELUKAST SODIUM 10 MG PO TABS: 10.0000 mg | ORAL_TABLET | Freq: Every day | ORAL | 3 refills | Status: AC

## 2023-11-27 MED ORDER — METFORMIN HCL ER 500 MG PO TB24: 500.0000 mg | ORAL_TABLET | Freq: Every day | ORAL | 2 refills | Status: AC

## 2023-11-27 NOTE — Assessment & Plan Note (Signed)
Chronic, stable.  Check CMP, CBC, lipid panel today. 

## 2023-11-27 NOTE — Assessment & Plan Note (Signed)
 Check B12 today.

## 2023-11-27 NOTE — Assessment & Plan Note (Signed)
 Chronic, stable. Continue zyrtec 10mg  daily, montelukast  10mg  daily and flonase.
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
## 2023-11-27 NOTE — Patient Instructions (Signed)
 It was great to see you!  We are checking your labs today and will let you know the results via mychart/phone.   Start metformin once a day with food.   Let's follow-up in 3 months, sooner if you have concerns.  If a referral was placed today, you will be contacted for an appointment. Please note that routine referrals can sometimes take up to 3-4 weeks to process. Please call our office if you haven't heard anything after this time frame.  Take care,  Rheba Cedar, NP

## 2023-11-27 NOTE — Assessment & Plan Note (Signed)
Health maintenance reviewed and updated. Discussed nutrition, exercise. Check CMP, CBC today. Follow-up 1 year.   
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
## 2023-11-27 NOTE — Assessment & Plan Note (Signed)
Chronic, stable. She is currently following with a therapist and psychiatrist. Continue wellbutrin 300mg  daily and lamictal 200mg  daily. Continue collaboration and recommendations from psychiatry.

## 2023-11-27 NOTE — Assessment & Plan Note (Signed)
Chronic, stable. She states the headaches have improved with taking lamictal for bipolar. She can continue tylenol and ibuprofen as needed for acute headache. Follow-up if symptoms worsen or start to occur more frequently.

## 2023-11-28 ENCOUNTER — Ambulatory Visit: Payer: Self-pay | Admitting: Nurse Practitioner

## 2023-11-28 LAB — LIPID PANEL
Cholesterol: 204 mg/dL — ABNORMAL HIGH (ref 0–200)
HDL: 32.3 mg/dL — ABNORMAL LOW (ref >39.00)
LDL Cholesterol: 133 mg/dL — ABNORMAL HIGH (ref 0–99)
NonHDL: 171.65
Total CHOL/HDL Ratio: 6
Triglycerides: 194 mg/dL — ABNORMAL HIGH (ref 0.0–149.0)
VLDL: 38.8 mg/dL (ref 0.0–40.0)

## 2023-11-28 LAB — HEMOGLOBIN A1C: Hgb A1c MFr Bld: 5.5 % (ref 4.6–6.5)

## 2023-11-28 LAB — CBC WITH DIFFERENTIAL/PLATELET
Basophils Absolute: 0.1 10*3/uL (ref 0.0–0.1)
Basophils Relative: 1.1 % (ref 0.0–3.0)
Eosinophils Absolute: 0.3 10*3/uL (ref 0.0–0.7)
Eosinophils Relative: 4.5 % (ref 0.0–5.0)
HCT: 37 % (ref 36.0–46.0)
Hemoglobin: 12.3 g/dL (ref 12.0–15.0)
Lymphocytes Relative: 40.5 % (ref 12.0–46.0)
Lymphs Abs: 2.5 10*3/uL (ref 0.7–4.0)
MCHC: 33.2 g/dL (ref 30.0–36.0)
MCV: 87.6 fl (ref 78.0–100.0)
Monocytes Absolute: 0.6 10*3/uL (ref 0.1–1.0)
Monocytes Relative: 9 % (ref 3.0–12.0)
Neutro Abs: 2.8 10*3/uL (ref 1.4–7.7)
Neutrophils Relative %: 44.9 % (ref 43.0–77.0)
Platelets: 225 10*3/uL (ref 150.0–400.0)
RBC: 4.23 Mil/uL (ref 3.87–5.11)
RDW: 14.3 % (ref 11.5–15.5)
WBC: 6.1 10*3/uL (ref 4.0–10.5)

## 2023-11-28 LAB — COMPREHENSIVE METABOLIC PANEL WITH GFR
ALT: 28 U/L (ref 0–35)
AST: 20 U/L (ref 0–37)
Albumin: 4.3 g/dL (ref 3.5–5.2)
Alkaline Phosphatase: 53 U/L (ref 39–117)
BUN: 13 mg/dL (ref 6–23)
CO2: 26 meq/L (ref 19–32)
Calcium: 9.5 mg/dL (ref 8.4–10.5)
Chloride: 103 meq/L (ref 96–112)
Creatinine, Ser: 1.14 mg/dL (ref 0.40–1.20)
GFR: 64.12 mL/min (ref >60.00)
Glucose, Bld: 98 mg/dL (ref 70–99)
Potassium: 3.7 meq/L (ref 3.5–5.1)
Sodium: 137 meq/L (ref 135–145)
Total Bilirubin: 0.4 mg/dL (ref 0.2–1.2)
Total Protein: 7.2 g/dL (ref 6.0–8.3)

## 2023-11-28 LAB — VITAMIN B12: Vitamin B-12: 235 pg/mL (ref 211–911)

## 2023-12-04 DIAGNOSIS — Z419 Encounter for procedure for purposes other than remedying health state, unspecified: Secondary | ICD-10-CM | POA: Diagnosis not present

## 2024-01-15 ENCOUNTER — Telehealth: Payer: Self-pay | Admitting: Adult Health

## 2024-01-15 ENCOUNTER — Encounter: Payer: Self-pay | Admitting: Adult Health

## 2024-01-15 DIAGNOSIS — G4700 Insomnia, unspecified: Secondary | ICD-10-CM

## 2024-01-15 DIAGNOSIS — F319 Bipolar disorder, unspecified: Secondary | ICD-10-CM | POA: Diagnosis not present

## 2024-01-15 DIAGNOSIS — F411 Generalized anxiety disorder: Secondary | ICD-10-CM | POA: Diagnosis not present

## 2024-01-15 MED ORDER — BUPROPION HCL ER (XL) 300 MG PO TB24: ORAL_TABLET | ORAL | 5 refills | Status: DC

## 2024-01-15 MED ORDER — HYDROXYZINE HCL 25 MG PO TABS: ORAL_TABLET | ORAL | 5 refills | Status: DC

## 2024-01-15 MED ORDER — LAMOTRIGINE 100 MG PO TABS: ORAL_TABLET | ORAL | 5 refills | Status: DC
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
## 2024-03-01 ENCOUNTER — Ambulatory Visit: Admitting: Nurse Practitioner

## 2024-03-02 ENCOUNTER — Telehealth: Payer: Self-pay | Admitting: Nurse Practitioner

## 2024-03-02 NOTE — Telephone Encounter (Signed)
 10/07/2023 no show 03/01/2024 no show  Final warning sent via mail and mychart

## 2024-03-03 NOTE — Telephone Encounter (Signed)
 Noted

## 2024-03-05 DIAGNOSIS — Z419 Encounter for procedure for purposes other than remedying health state, unspecified: Secondary | ICD-10-CM | POA: Diagnosis not present

## 2024-06-04 DIAGNOSIS — Z419 Encounter for procedure for purposes other than remedying health state, unspecified: Secondary | ICD-10-CM | POA: Diagnosis not present

## 2024-07-19 ENCOUNTER — Encounter: Payer: Self-pay | Admitting: Adult Health

## 2024-07-19 ENCOUNTER — Telehealth: Payer: Self-pay | Admitting: Adult Health

## 2024-07-19 DIAGNOSIS — F319 Bipolar disorder, unspecified: Secondary | ICD-10-CM

## 2024-07-19 DIAGNOSIS — G4700 Insomnia, unspecified: Secondary | ICD-10-CM | POA: Diagnosis not present

## 2024-07-19 MED ORDER — HYDROXYZINE HCL 25 MG PO TABS: ORAL_TABLET | ORAL | 5 refills | Status: AC

## 2024-07-19 MED ORDER — BUPROPION HCL ER (XL) 300 MG PO TB24: ORAL_TABLET | ORAL | 5 refills | Status: AC

## 2024-07-19 MED ORDER — LAMOTRIGINE 100 MG PO TABS: ORAL_TABLET | ORAL | 5 refills | Status: AC
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
